# Patient Record
Sex: Female | Born: 2000 | Race: White | Hispanic: No | Smoking: Current some day smoker
Health system: Southern US, Community
[De-identification: ages and names within clinical notes are randomized; demographics above are authoritative.]

## PROBLEM LIST (undated history)

## (undated) VITALS — BP 80/46 | HR 150 | Temp 98.3°F | Resp 14 | Ht 64.37 in | Wt 103.6 lb

## (undated) DIAGNOSIS — F32A Depression, unspecified: Secondary | ICD-10-CM

## (undated) DIAGNOSIS — H7292 Unspecified perforation of tympanic membrane, left ear: Secondary | ICD-10-CM

## (undated) DIAGNOSIS — K000 Anodontia: Secondary | ICD-10-CM

## (undated) DIAGNOSIS — K08109 Complete loss of teeth, unspecified cause, unspecified class: Secondary | ICD-10-CM

## (undated) DIAGNOSIS — F319 Bipolar disorder, unspecified: Secondary | ICD-10-CM

## (undated) DIAGNOSIS — F82 Specific developmental disorder of motor function: Secondary | ICD-10-CM

## (undated) DIAGNOSIS — F419 Anxiety disorder, unspecified: Secondary | ICD-10-CM

## (undated) HISTORY — PX: TYMPANOPLASTY: SHX33

## (undated) HISTORY — PX: TYMPANOSTOMY TUBE PLACEMENT: SHX32

---
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
## 2019-01-18 ENCOUNTER — Telehealth: Payer: Self-pay | Admitting: Family Medicine

## 2019-02-23 ENCOUNTER — Other Ambulatory Visit: Payer: Self-pay

## 2019-02-23 ENCOUNTER — Ambulatory Visit (INDEPENDENT_AMBULATORY_CARE_PROVIDER_SITE_OTHER): Payer: Medicaid Other

## 2019-02-23 DIAGNOSIS — N946 Dysmenorrhea, unspecified: Secondary | ICD-10-CM | POA: Diagnosis not present

## 2019-02-23 DIAGNOSIS — Z7251 High risk heterosexual behavior: Secondary | ICD-10-CM

## 2019-02-23 DIAGNOSIS — Z23 Encounter for immunization: Secondary | ICD-10-CM

## 2019-02-23 LAB — POCT URINE PREGNANCY: Preg Test, Ur: NEGATIVE

## 2019-02-23 NOTE — Progress Notes (Signed)
Pt here for influenza vaccine.  Screening questionnaire reviewed, VIS provided to patient, and any/all patient questions answered.  Pt also request UPT while here.  She states that she has had unprotected sex and LMP was 12/07/2018.  Charyl Bigger, CMA

## 2019-03-31 ENCOUNTER — Other Ambulatory Visit: Payer: Self-pay

## 2019-03-31 ENCOUNTER — Ambulatory Visit (HOSPITAL_COMMUNITY)
Admission: RE | Admit: 2019-03-31 | Discharge: 2019-03-31 | Disposition: A | Discharge status: Home / Self Care | Payer: Medicaid Other | Attending: Psychiatry | Admitting: Psychiatry

## 2019-03-31 ENCOUNTER — Encounter (HOSPITAL_COMMUNITY): Payer: Self-pay | Admitting: Emergency Medicine

## 2019-03-31 ENCOUNTER — Emergency Department (HOSPITAL_COMMUNITY)
Admission: EM | Admit: 2019-03-31 | Discharge: 2019-04-01 | Disposition: A | Discharge status: Other Acute Inpatient Hospital | Payer: Medicaid Other | Attending: Emergency Medicine | Admitting: Emergency Medicine

## 2019-03-31 DIAGNOSIS — Z915 Personal history of self-harm: Secondary | ICD-10-CM | POA: Insufficient documentation

## 2019-03-31 DIAGNOSIS — Z20828 Contact with and (suspected) exposure to other viral communicable diseases: Secondary | ICD-10-CM | POA: Diagnosis not present

## 2019-03-31 DIAGNOSIS — R45851 Suicidal ideations: Secondary | ICD-10-CM | POA: Diagnosis not present

## 2019-03-31 DIAGNOSIS — Z79899 Other long term (current) drug therapy: Secondary | ICD-10-CM | POA: Insufficient documentation

## 2019-03-31 DIAGNOSIS — F17200 Nicotine dependence, unspecified, uncomplicated: Secondary | ICD-10-CM | POA: Insufficient documentation

## 2019-03-31 DIAGNOSIS — F32A Depression, unspecified: Secondary | ICD-10-CM

## 2019-03-31 DIAGNOSIS — F329 Major depressive disorder, single episode, unspecified: Secondary | ICD-10-CM | POA: Insufficient documentation

## 2019-03-31 DIAGNOSIS — F332 Major depressive disorder, recurrent severe without psychotic features: Secondary | ICD-10-CM | POA: Diagnosis present

## 2019-03-31 DIAGNOSIS — F17210 Nicotine dependence, cigarettes, uncomplicated: Secondary | ICD-10-CM | POA: Insufficient documentation

## 2019-03-31 LAB — CBC
HCT: 40.9 % (ref 36.0–46.0)
Hemoglobin: 14 g/dL (ref 12.0–15.0)
MCH: 31.9 pg (ref 26.0–34.0)
MCHC: 34.2 g/dL (ref 30.0–36.0)
MCV: 93.2 fL (ref 80.0–100.0)
Platelets: 260 10*3/uL (ref 150–400)
RBC: 4.39 MIL/uL (ref 3.87–5.11)
RDW: 12.1 % (ref 11.5–15.5)
WBC: 8.6 10*3/uL (ref 4.0–10.5)
nRBC: 0 % (ref 0.0–0.2)

## 2019-03-31 LAB — COMPREHENSIVE METABOLIC PANEL
ALT: 17 U/L (ref 0–44)
AST: 17 U/L (ref 15–41)
Albumin: 4.5 g/dL (ref 3.5–5.0)
Alkaline Phosphatase: 56 U/L (ref 38–126)
Anion gap: 12 (ref 5–15)
BUN: 8 mg/dL (ref 6–20)
CO2: 23 mmol/L (ref 22–32)
Calcium: 9.7 mg/dL (ref 8.9–10.3)
Chloride: 106 mmol/L (ref 98–111)
Creatinine, Ser: 0.69 mg/dL (ref 0.44–1.00)
GFR calc Af Amer: >60 mL/min (ref >60)
GFR calc non Af Amer: >60 mL/min (ref >60)
Glucose, Bld: 90 mg/dL (ref 70–99)
Potassium: 3.1 mmol/L — ABNORMAL LOW (ref 3.5–5.1)
Sodium: 141 mmol/L (ref 135–145)
Total Bilirubin: 0.2 mg/dL — ABNORMAL LOW (ref 0.3–1.2)
Total Protein: 7 g/dL (ref 6.5–8.1)

## 2019-03-31 LAB — ETHANOL: Alcohol, Ethyl (B): <10 mg/dL (ref <10)

## 2019-03-31 LAB — SALICYLATE LEVEL: Salicylate Lvl: <7 mg/dL (ref 2.8–30.0)

## 2019-03-31 LAB — RAPID URINE DRUG SCREEN, HOSP PERFORMED
Amphetamines: NOT DETECTED
Barbiturates: NOT DETECTED
Benzodiazepines: NOT DETECTED
Cocaine: NOT DETECTED
Opiates: NOT DETECTED
Tetrahydrocannabinol: NOT DETECTED

## 2019-03-31 LAB — ACETAMINOPHEN LEVEL: Acetaminophen (Tylenol), Serum: <10 ug/mL — ABNORMAL LOW (ref 10–30)

## 2019-03-31 LAB — I-STAT BETA HCG BLOOD, ED (MC, WL, AP ONLY): I-stat hCG, quantitative: <5 m[IU]/mL (ref <5)

## 2019-03-31 MED ORDER — POTASSIUM CHLORIDE CRYS ER 20 MEQ PO TBCR
40.0000 meq | EXTENDED_RELEASE_TABLET | Freq: Once | ORAL | Status: AC
  Administered 2019-03-31: 23:00:00 40 meq via ORAL
  Filled 2019-03-31: qty 2

## 2019-03-31 MED ORDER — ACETAMINOPHEN 325 MG PO TABS: 650.0000 mg | ORAL_TABLET | ORAL | Status: DC | PRN
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
## 2019-03-31 NOTE — ED Notes (Signed)
Pt denies SI/HI at this point in time
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
## 2019-04-01 LAB — SARS CORONAVIRUS 2 (TAT 6-24 HRS): SARS Coronavirus 2: NEGATIVE

## 2019-04-01 NOTE — ED Notes (Signed)
Breakfast tray ordered 
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
## 2019-04-01 NOTE — ED Notes (Signed)
Pt woke - ate breakfast - then returned to sleeping.  

## 2019-04-01 NOTE — ED Provider Notes (Signed)
Vitals:   04/01/19 0642 04/01/19 1221  BP: 110/74 100/68  Pulse: 85 99  Resp: 14 16  Temp: 98.4 F (36.9 C)   SpO2: 98% 98%    Patient accepted at [facility].  Labs reviewed.  Hemodynamically stable.  Covid negative.  EMTALA completed.   Varney Biles, MD 04/01/19 1259

## 2019-04-01 NOTE — Progress Notes (Signed)
This patient continues to meet inpatient criteria. CSW faxed information to the following facilities:   [facility] is at capacity, no appropriate bed is available at this time.  Stephanie Acre, [place] Social Worker [phone]

## 2019-04-01 NOTE — Progress Notes (Signed)
This [facility].  Bed: Aldona Lento  RN Call for report: [phone]  Accepting provider: Dr.Reddy  This patient is currently voluntary, she may arrive after Bear Dance, [place] Social Worker [phone]

## 2019-04-01 NOTE — ED Notes (Signed)
Pt voiced understanding and agreement w/tx plan - Accepted to [facility]. Pt calling her [place] [phone] - to advise.

## 2019-04-01 NOTE — BH Assessment (Signed)
[facility] Assessment Progress Note    Per Modesto Charon, NP, patient continues to be recommended for inpatient treatment

## 2019-04-01 NOTE — ED Notes (Signed)
Ordered bfast 

## 2019-04-01 NOTE — ED Notes (Signed)
Per Marijean Bravo from [facility], pt meets criteria for inpatient tx.  No beds available at [facility].  Awaiting placement.

## 2019-04-01 NOTE — ED Notes (Signed)
Safe Transport transporting pt to [place] belongings - 1 labeled belongings bag - Safe Transport - Pt aware.

## 2019-04-01 NOTE — ED Notes (Signed)
Lunch tray ordered 

## 2019-05-01 ENCOUNTER — Encounter: Payer: Self-pay | Admitting: Family Medicine

## 2019-05-01 ENCOUNTER — Other Ambulatory Visit: Payer: Self-pay

## 2019-05-01 ENCOUNTER — Ambulatory Visit (INDEPENDENT_AMBULATORY_CARE_PROVIDER_SITE_OTHER): Payer: Medicaid Other | Admitting: Family Medicine

## 2019-05-01 VITALS — Ht 69.0 in | Wt 120.0 lb

## 2019-05-01 DIAGNOSIS — T148XXA Other injury of unspecified body region, initial encounter: Secondary | ICD-10-CM | POA: Diagnosis not present

## 2019-05-01 DIAGNOSIS — M549 Dorsalgia, unspecified: Secondary | ICD-10-CM | POA: Diagnosis not present

## 2019-05-01 NOTE — Progress Notes (Signed)
Virtual / live video office visit note for Melissa [name], D.O- Primary Care Physician at [facility]   I connected with current patient today and beyond visually recognizing the correct individual, I verified that I am speaking with the correct person using two identifiers.  . Location of the patient: Home . Location of the provider: Office Only the patient (+/- their family members at pt's discretion) and myself were participating in the encounter    - This visit type was conducted due to national recommendations for restrictions regarding the COVID-19 Pandemic (e.g. social distancing) in an effort to limit this patient's exposure and mitigate transmission in our community.  This format is felt to be most appropriate for this patient at this time.   - The patient did have access to video technology today  - No physical exam could be performed with this format, beyond that communicated to [place] by the patient/ family members as noted.   - Additionally my office staff/ schedulers discussed with the patient that there may be a monetary charge related to this service, depending on patient's medical insurance.   The patient expressed understanding, and agreed to proceed.      History of Present Illness:  I, Melissa [name], am serving as scribe for Dr. Mellody Dance.  - Back Pain A week ago, "I was sitting down in a chair in the kitchen, a 'barstool type chair.'"  Notes she "felt a pop or a crack" when she got up to do something for her grandfather, and notes "I had to bend over because it felt like I was going to fall."  Says "I didn't feel pain, just a crack, but it was so painful after the crack."  If she sits up or back in a certain way, her back will hurt.  Quality: "It's a sharp pain."  Location: "Lower back, but also toward the middle."  Notes it radiates down to her inner thigh, but not beyond the knee.  Denies radiculopathy.  Says while making her bed to go to sleep  recently, her back and legs started hurting.  She started having headaches after her back injury.  Patient thinks her headaches are unrelated to the back ache.  Thinks her headaches are due to "being tired or stressed because of school."  Has not had back pain like this before.  Overall her back pain has improved.   Notes that walking helps it to improve.  Worse with: lifting; notes "carrying things in for my grandfather doesn't help."  Treating her symptoms with ibuprofen and heating pad.  Notes "took ibuprofen once and it didn't help."  Doesn't feel any pain first thing in the morning.  Has never woken up in the middle of the night with pain.  She has continued doing activities as normal; denies the pain altering her activities.  Notes that she had a little urinary concern (burning) recently until her "grandmother gave me a vitamin for it."  Denies runny nose, fever/chills, sore throat, loss of taste or smell.  Denies nausea, vomiting, diarrhea.    Depression screen [facility] 2/9 05/01/2019 07/22/2017 04/21/2017 01/27/2017 01/11/2017  Decreased Interest 0 0 2 2 1   Down, Depressed, Hopeless 0 1 1 1 2   PHQ - 2 Score 0 1 3 3 3   Altered sleeping 0 0 0 0 0  Tired, decreased energy 0 1 1 0 1  Change in appetite 0 1 0 0 2  Feeling bad or failure about yourself  2 1 2 3 1   Trouble concentrating 0 0 0 0 0  Moving slowly or fidgety/restless 0 0 0 0 0  Suicidal thoughts 0 0 2 1 2   PHQ-9 Score 2 4 8 7 9   Difficult doing work/chores Not difficult at all Not difficult at all Not difficult at all Somewhat difficult -    Impression and Recommendations:    1. Musculoskeletal back pain   2. Muscle strain    Headache and pharyngitis: - Had a negative COVID-19 test one month ago, but today still has symptoms of headache, sore throat etc. - Extensively reviewed covid symptoms with patient today.  Back pain: - Discussed that at her age, the most common cause of stated pain and symptoms will likely  be a muscle pull/strain.  Reviewed that if pain was shooting down her leg into her foot, this would be more indicative of neuropathic pain.  - To help alleviate symptoms, advised patient to avoid heavy lifting. - For back pain and muscle pulls, discussed importance of regular low-impact walking. - Told patient to avoid sitting around, and try to move around prudently as often as possible.  - Encouraged patient to continue to apply heating pad for 15-minute increments, 3-4 times per day.  - Patient may take 1-2 ibuprofen every 6 hours as needed, but only if pain is unmanageable.  - Reviewed that this pain should resolve within two weeks of onset. - Encouraged gentle back stretching and strengthening exercises after pain completely resolves.  - Handouts printed today for patient to pick up at her convenience-these are from sports med patient advisor.    - Additionally, discussion had with patient regarding txmnt plan, their biases about that plan etc were used in my medical decision making today.    - The patient agreed with the plan and demonstrated an understanding of the instructions.   No barriers to understanding were identified.    - Red flag symptoms and signs discussed in detail.  Patient expressed understanding regarding what to do in case of emergency\ urgent symptoms.  The patient was advised to call back or seek an in-person evaluation if the symptoms worsen or if the condition fails to improve as anticipated.   Return if symptoms worsen or fail to improve, for Told patient please follow-up for chronic care as previously discussed.  Due for yearly physical.    No orders of the defined types were placed in this encounter.   No orders of the defined types were placed in this encounter.   There are no discontinued medications.    Note:  This note was prepared with assistance of Dragon voice recognition software. Occasional wrong-word or sound-a-like substitutions may have  occurred due to the inherent limitations of voice recognition software.  This document serves as a record of services personally performed by Thomasene Loteborah Lisette Mancebo, DO. It was created on her behalf by Peggye FothergillKatherine Galloway, a trained medical scribe. The creation of this record is based on the scribe's personal observations and the provider's statements to them.   This case required medical decision making of at least moderate complexity. The above documentation has been reviewed to be accurate and was completed by Carlye Grippeeborah J. Terrace Fontanilla, D.O.       Patient Care Team    Relationship Specialty Notifications Start End  Thomasene Lotpalski, Kariyah Baugh, DO PCP - General Family Medicine  11/19/15    [facility]    -Vitals obtained; medications/ allergies reconciled;  personal medical, social, Sx  etc.histories were updated by CMA, reviewed by me and are reflected in chart  Patient Active Problem List   Diagnosis Date Noted  . Musculoskeletal back pain 05/01/2019  . Muscle strain 05/01/2019  . Intentional overdose of drug in tablet form (HCC)   . General counseling and advice for contraceptive management 11/19/2015  . Encounter for childhood immunizations appropriate for age 68/13/2017  . Encounter for initial prescription of injectable contraceptive 11/19/2015  . Bipolar disorder, current episode depressed, severe, without psychotic features (HCC) 02/22/2015  . Post traumatic stress disorder (PTSD) 06/30/2013     Current Meds  Medication Sig  . busPIRone (BUSPAR) 10 MG tablet Take 1 tablet (10 mg total) by mouth 2 (two) times daily.  . Calcium Carbonate-Vitamin D (CALCIUM 500 + D) 500-125 MG-UNIT TABS Take 1 tablet by mouth 2 (two) times daily with a meal.  . carbamazepine (TEGRETOL-XR) 200 MG 12 hr tablet Take 1 tablet (200 mg total) by mouth 2 (two) times daily.  . Melatonin 5 MG TABS Take 10 mg by mouth at bedtime as needed (for sleep).  . Multiple Vitamin (MULTIVITAMIN WITH  MINERALS) TABS tablet Take 1 tablet by mouth daily.  . sertraline (ZOLOFT) 100 MG tablet Take 1 tablet (100 mg total) by mouth daily.     No Known Allergies   ROS:  See above HPI for pertinent positives and negatives   Objective:   Height 5\' 9"  (1.753 m), weight 120 lb (54.4 kg), last menstrual period 03/31/2019.  (if some vitals are omitted, this means that patient was UNABLE to obtain them even though they were asked to get them prior to OV today.  They were asked to call 04/02/2019 at their earliest convenience with these once obtained.)  General: A & O * 3; visually in no acute distress; in usual state of health.  Skin: Visible skin appears normal and pt's usual skin color HEENT:  EOMI, head is normocephalic and atraumatic.  Sclera are anicteric. Neck has a good range of motion.  Lips are noncyanotic Chest: normal chest excursion and movement Respiratory: speaking in full sentences, no conversational dyspnea; no use of accessory muscles Psych: insight good, mood- appears full

## 2019-06-01 ENCOUNTER — Ambulatory Visit: Payer: Medicaid Other | Attending: Internal Medicine

## 2019-06-01 ENCOUNTER — Other Ambulatory Visit: Payer: Medicaid Other

## 2019-06-01 DIAGNOSIS — U071 COVID-19: Secondary | ICD-10-CM

## 2019-06-02 LAB — NOVEL CORONAVIRUS, NAA: SARS-CoV-2, NAA: NOT DETECTED

## 2019-08-26 ENCOUNTER — Encounter (HOSPITAL_COMMUNITY): Payer: Self-pay | Admitting: Emergency Medicine

## 2019-08-26 ENCOUNTER — Other Ambulatory Visit: Payer: Self-pay

## 2019-08-26 ENCOUNTER — Ambulatory Visit (HOSPITAL_COMMUNITY)
Admission: EM | Admit: 2019-08-26 | Discharge: 2019-08-26 | Disposition: A | Discharge status: Home / Self Care | Payer: Medicaid Other | Attending: Family Medicine | Admitting: Family Medicine

## 2019-08-26 DIAGNOSIS — Z3202 Encounter for pregnancy test, result negative: Secondary | ICD-10-CM | POA: Diagnosis not present

## 2019-08-26 DIAGNOSIS — Z113 Encounter for screening for infections with a predominantly sexual mode of transmission: Secondary | ICD-10-CM

## 2019-08-26 DIAGNOSIS — R300 Dysuria: Secondary | ICD-10-CM | POA: Diagnosis present

## 2019-08-26 LAB — POCT URINALYSIS DIP (DEVICE)
Bilirubin Urine: NEGATIVE
Glucose, UA: NEGATIVE mg/dL
Nitrite: NEGATIVE
Protein, ur: >=300 mg/dL — AB
Specific Gravity, Urine: >=1.03 (ref 1.005–1.030)
Urobilinogen, UA: 1 mg/dL (ref 0.0–1.0)
pH: 7 (ref 5.0–8.0)

## 2019-08-26 LAB — POCT PREGNANCY, URINE: Preg Test, Ur: NEGATIVE

## 2019-08-26 LAB — POC URINE PREG, ED: Preg Test, Ur: NEGATIVE

## 2019-08-26 MED ORDER — NITROFURANTOIN MONOHYD MACRO 100 MG PO CAPS: 100.0000 mg | ORAL_CAPSULE | Freq: Two times a day (BID) | ORAL | 0 refills | Status: DC

## 2019-08-26 NOTE — Discharge Instructions (Addendum)
Treating you for a urinary tract infection.  We will call you if any changes need to be made to your medicine. Also sending a swab for STD testing we will call you with any positive results of that. Make sure you increase your water intake you do appear to be mildly dehydrated on your urine sample. You can take AZO over-the-counter for discomfort as needed for urinary symptoms

## 2019-08-26 NOTE — ED Triage Notes (Signed)
Pt sts dysuria x 1 week and also requesting STD screening

## 2019-08-27 ENCOUNTER — Telehealth (HOSPITAL_COMMUNITY): Payer: Self-pay

## 2019-08-27 MED ORDER — NITROFURANTOIN MONOHYD MACRO 100 MG PO CAPS: 100.0000 mg | ORAL_CAPSULE | Freq: Two times a day (BID) | ORAL | 0 refills | Status: DC

## 2019-08-27 NOTE — Telephone Encounter (Signed)
Pt called stating that [facility] was closed yesterday and requested Rx for macrobid be sent to [facility] on [address] instead. Sent Rx to pharmacy, attempted to contact pt to inform her that Rx was indeed sent to the new pharmacy of choice, but her voicemail "mailbox was full".

## 2019-08-28 LAB — URINE CULTURE: Culture: >=100000 — AB

## 2019-08-28 NOTE — ED Provider Notes (Signed)
[place]    CSN: [id number] Arrival date & time: 08/26/19  1405      History   Chief Complaint Chief Complaint  Patient presents with  . Dysuria  . Exposure to STD    HPI Melissa [name] is a 19 y.o. female.   Patient is a 19 year old female presents today with dysuria, urinary frequency and lower abdominal discomfort that has been constant over the past week.  Started after having sexual intercourse, unprotected.  Concern for STDs.  Has also noticed some hematuria.  No back pain, fevers, chills, nausea or vomiting.  No vaginal discharge, itching or irritation.  ROS per HPI      Past Medical History:  Diagnosis Date  . Bipolar disorder ([place])   . Motor skills developmental delay   . No natural teeth   . 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 Musculoskeletal back pain 05/01/2019  . Muscle strain 05/01/2019  . Intentional overdose of drug in tablet form ([place])   . General counseling and advice for contraceptive management 11/19/2015  . Encounter for childhood immunizations appropriate for age 36/13/2017  . Encounter for initial prescription of injectable contraceptive 11/19/2015  . Bipolar disorder, current episode depressed, severe, without psychotic features ([place]) 02/22/2015  . Post traumatic stress disorder (PTSD) 06/30/2013    Past Surgical History:  Procedure Laterality Date  . ALVEOLOPLASTY  02/15/2017   x 4  . MULTIPLE TOOTH EXTRACTIONS  02/15/2017   #1 through #32 - also palatal tori removal  . TYMPANOPLASTY     unilateral - unknown side  . TYMPANOPLASTY Left 04/09/2017   Procedure: TYMPANOPLASTY;  Surgeon: Rozetta Nunnery, MD;  Location: [facility];  Service: ENT;  Laterality: Left;  . TYMPANOSTOMY TUBE PLACEMENT Bilateral     OB History   No obstetric history on file.      Home Medications    Prior to Admission medications   Medication Sig Start  Date End Date Taking? Authorizing Provider  busPIRone (BUSPAR) 10 MG tablet Take 1 tablet (10 mg total) by mouth 2 (two) times daily. 09/29/18   Ambrose Finland, MD  Calcium Carbonate-Vitamin D (CALCIUM 500 + D) 500-125 MG-UNIT TABS Take 1 tablet by mouth 2 (two) times daily with a meal.    [provider]  carbamazepine (TEGRETOL-XR) 200 MG 12 hr tablet Take 1 tablet (200 mg total) by mouth 2 (two) times daily. 09/29/18   Ambrose Finland, MD  Melatonin 5 MG TABS Take 10 mg by mouth at bedtime as needed (for sleep).    [provider]  Multiple Vitamin (MULTIVITAMIN WITH MINERALS) TABS tablet Take 1 tablet by mouth daily.    [provider]  nitrofurantoin, macrocrystal-monohydrate, (MACROBID) 100 MG capsule Take 1 capsule (100 mg total) by mouth 2 (two) times daily. 08/27/19   Loura Halt A, NP  sertraline (ZOLOFT) 100 MG tablet Take 1 tablet (100 mg total) by mouth daily. 09/29/18   Ambrose Finland, MD    Family History Family History  Problem Relation Age of Onset  . Seizures Mother        as a child    Social History Social History   Tobacco Use  . Smoking status: Light Tobacco Smoker    Years: 2.00  . Smokeless tobacco: Never Used  . Tobacco comment: somes a few cigarettes a month  Substance Use Topics  . Alcohol use: No  Comment: Pt denied  . Drug use: Yes    Types: Marijuana    Comment: Pt states she uses drugs when she is with "her older sister"; last time used waslast week     Allergies   Patient has no known allergies.   Review of Systems Review of Systems   Physical Exam Triage Vital Signs ED Triage Vitals [08/26/19 1508]  Enc Vitals Group     BP 115/79     Pulse Rate 95     Resp 18     Temp 97.7 F (36.5 C)     Temp Source Oral     SpO2 99 %     Weight      Height      Head Circumference      Peak Flow      Pain Score 5     Pain Loc      Pain Edu?      Excl. in GC?    No data  found.  Updated Vital Signs BP 115/79 (BP Location: Right Arm)   Pulse 95   Temp 97.7 F (36.5 C) (Oral)   Resp 18   SpO2 99%   Visual Acuity Right Eye Distance:   Left Eye Distance:   Bilateral Distance:    Right Eye Near:   Left Eye Near:    Bilateral Near:     Physical Exam Vitals and nursing note reviewed.  Constitutional:      General: She is not in acute distress.    Appearance: Normal appearance. She is not ill-appearing, toxic-appearing or diaphoretic.  HENT:     Head: Normocephalic.     Nose: Nose normal.     Mouth/Throat:     Pharynx: Oropharynx is clear.  Eyes:     Conjunctiva/sclera: Conjunctivae normal.  Pulmonary:     Effort: Pulmonary effort is normal.  Musculoskeletal:        General: Normal range of motion.     Cervical back: Normal range of motion.  Skin:    General: Skin is warm and dry.     Findings: No rash.  Neurological:     Mental Status: She is alert.  Psychiatric:        Mood and Affect: Mood normal.      UC Treatments / Results  Labs (all labs ordered are listed, but only abnormal results are displayed) Labs Reviewed  URINE CULTURE - Abnormal; Notable for the following components:      Result Value   Culture >=100,000 COLONIES/mL ESCHERICHIA COLI (*)    Organism ID, Bacteria ESCHERICHIA COLI (*)    All other components within normal limits  POCT URINALYSIS DIP (DEVICE) - Abnormal; Notable for the following components:   Ketones, ur TRACE (*)    Hgb urine dipstick MODERATE (*)    Protein, ur >=300 (*)    Leukocytes,Ua SMALL (*)    All other components within normal limits  POCT PREGNANCY, URINE  POC URINE PREG, ED  CERVICOVAGINAL ANCILLARY ONLY    EKG   Radiology No results found.  Procedures Procedures (including critical care time)  Medications Ordered in UC Medications - No data to display  Initial Impression / Assessment and Plan / UC Course  I have reviewed the triage vital signs and the nursing  notes.  Pertinent labs & imaging results that were available during my care of the patient were reviewed by me and considered in my medical decision making (see chart for details).  Dysuria-urine with small leuks, moderate hemoglobin Sending for culture and will go ahead and treat for urinary tract infection with Macrobid. Sending swab for STD testing Labs pending Recommend increase fluid intake to include water and avoid bladder irritating beverages like soda and tea Final Clinical Impressions(s) / UC Diagnoses   Final diagnoses:  Dysuria  Screening for STD (sexually transmitted disease)     Discharge Instructions     Treating you for a urinary tract infection.  We will call you if any changes need to be made to your medicine. Also sending a swab for STD testing we will call you with any positive results of that. Make sure you increase your water intake you do appear to be mildly dehydrated on your urine sample. You can take AZO over-the-counter for discomfort as needed for urinary symptoms    ED Prescriptions    Medication Sig Dispense Auth. Provider   nitrofurantoin, macrocrystal-monohydrate, (MACROBID) 100 MG capsule Take 1 capsule (100 mg total) by mouth 2 (two) times daily. 10 capsule Dahlia Byes A, NP     PDMP not reviewed this encounter.   Dahlia Byes A, NP 08/28/19 1050

## 2019-08-29 LAB — CERVICOVAGINAL ANCILLARY ONLY
Bacterial vaginitis: NEGATIVE
Candida vaginitis: NEGATIVE
Chlamydia: NEGATIVE
Neisseria Gonorrhea: NEGATIVE
Trichomonas: NEGATIVE

## 2019-12-05 ENCOUNTER — Encounter: Payer: Medicaid Other | Admitting: Physician Assistant

## 2020-01-08 ENCOUNTER — Encounter: Payer: Medicaid Other | Admitting: Physician Assistant

## 2020-01-17 ENCOUNTER — Other Ambulatory Visit: Payer: Self-pay

## 2020-01-17 DIAGNOSIS — N912 Amenorrhea, unspecified: Secondary | ICD-10-CM

## 2020-01-18 ENCOUNTER — Other Ambulatory Visit: Payer: Medicaid Other

## 2020-01-18 ENCOUNTER — Other Ambulatory Visit: Payer: Self-pay

## 2020-01-18 DIAGNOSIS — N912 Amenorrhea, unspecified: Secondary | ICD-10-CM

## 2020-01-19 LAB — BETA HCG QUANT (REF LAB): hCG Quant: <1 m[IU]/mL

## 2020-01-21 IMAGING — DX DG CHEST 1V PORT
1 series · 1 of 1 positions shown · non-contrast
Comparison: none

CLINICAL DATA: Post-intubation. Drug overdose. Smoker.intubation

EXAM:
PORTABLE CHEST 1 VIEW

[chest ap]
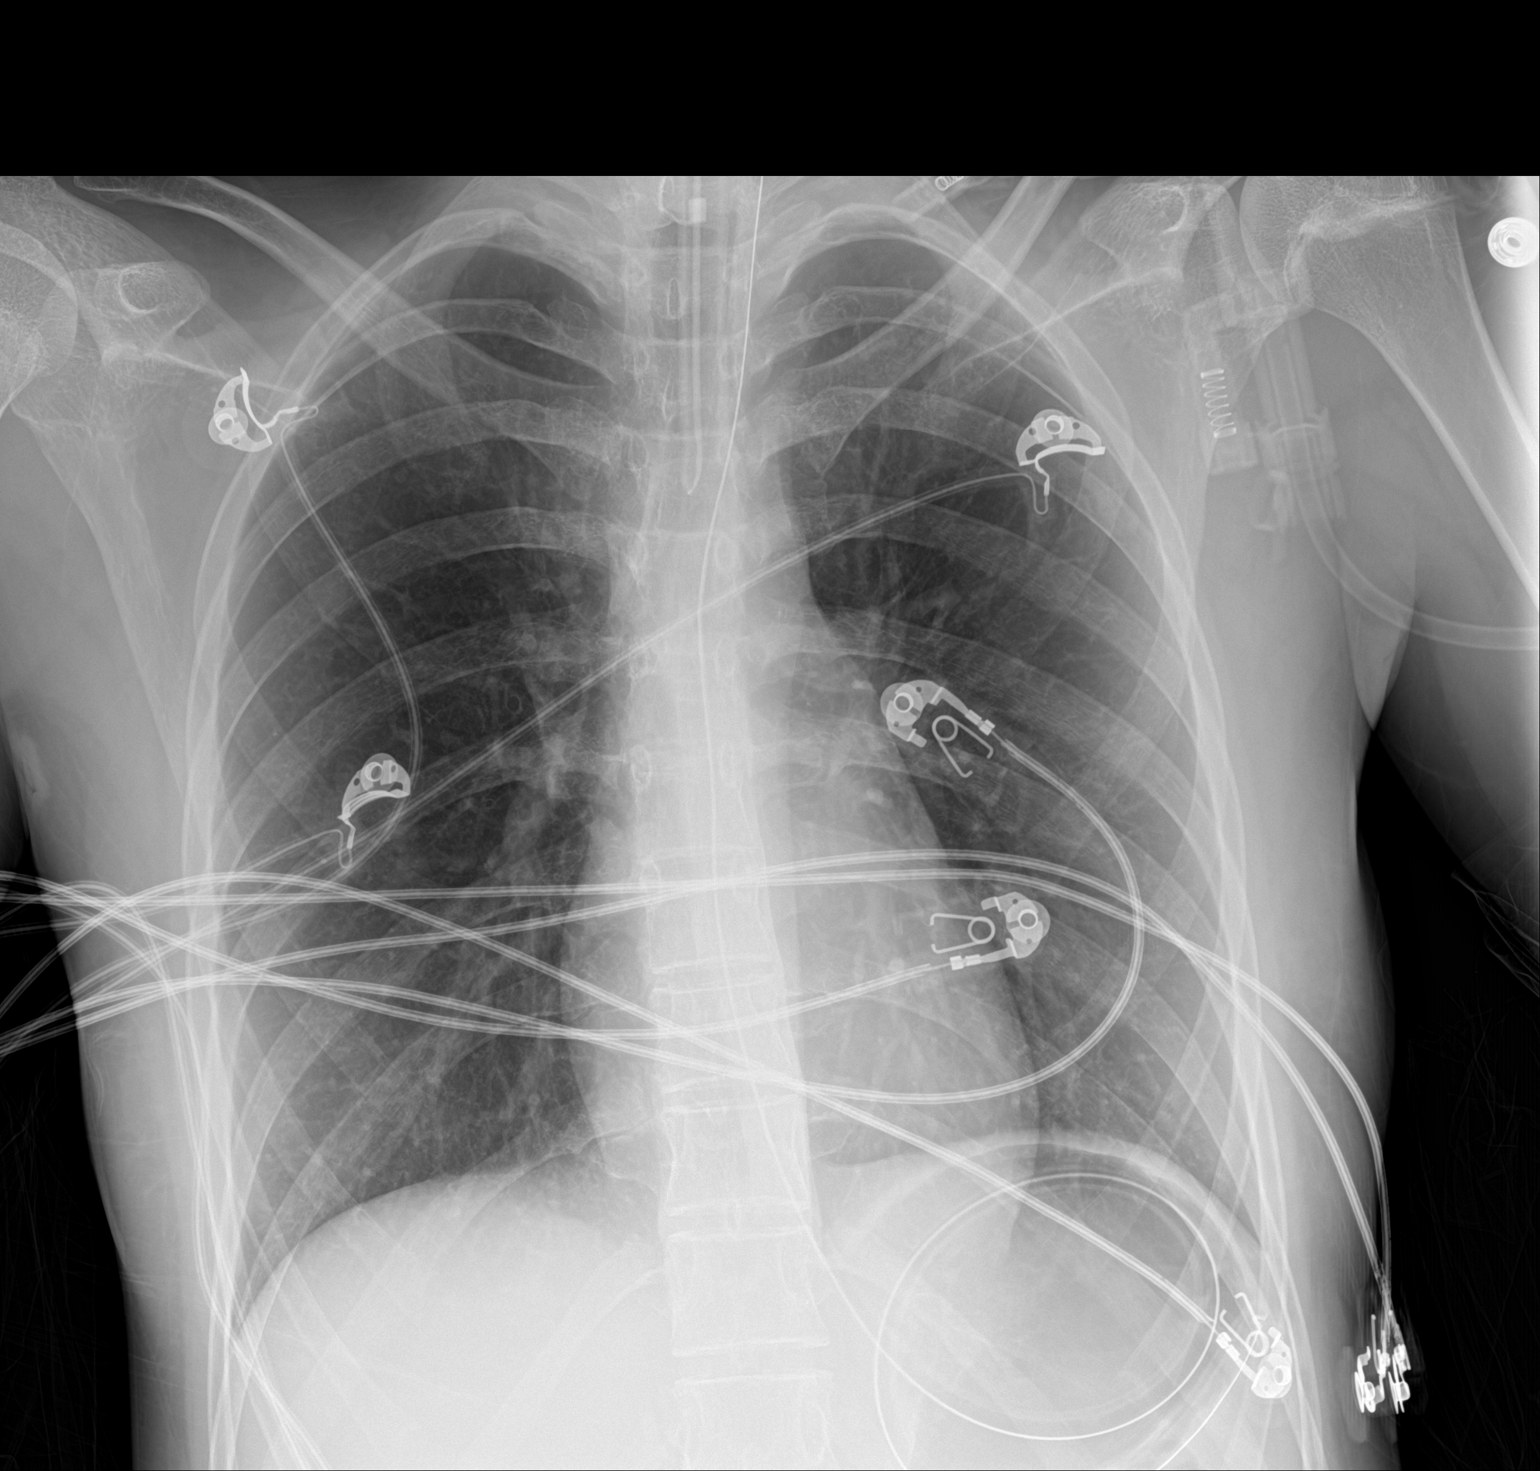

[1 of 1 positions shown; findings below may reference images not displayed]

FINDINGS: Endotracheal tube 3.5 cm from carina. NG tube in stomach. Normal
cardiac silhouette. Lungs are clear. No pneumothorax.
IMPRESSION: 1. Support apparatus in good position.
2. Lungs clear.

## 2020-01-24 ENCOUNTER — Encounter: Payer: Self-pay | Admitting: Emergency Medicine

## 2020-01-24 ENCOUNTER — Ambulatory Visit (HOSPITAL_COMMUNITY)
Admission: EM | Admit: 2020-01-24 | Discharge: 2020-01-24 | Discharge status: Against Medical Advice | Payer: Medicaid Other

## 2020-01-24 ENCOUNTER — Other Ambulatory Visit: Payer: Self-pay

## 2020-01-24 ENCOUNTER — Ambulatory Visit
Admission: EM | Admit: 2020-01-24 | Discharge: 2020-01-24 | Disposition: A | Discharge status: Home / Self Care | Payer: Medicaid Other | Attending: Emergency Medicine | Admitting: Emergency Medicine

## 2020-01-24 ENCOUNTER — Telehealth: Payer: Self-pay | Admitting: Physician Assistant

## 2020-01-24 DIAGNOSIS — J069 Acute upper respiratory infection, unspecified: Secondary | ICD-10-CM | POA: Diagnosis not present

## 2020-01-24 DIAGNOSIS — Z7689 Persons encountering health services in other specified circumstances: Secondary | ICD-10-CM | POA: Diagnosis not present

## 2020-01-24 NOTE — ED Provider Notes (Signed)
[name]    CSN: [id number] Arrival date & time: 01/24/20  1618      History   Chief Complaint Chief Complaint  Patient presents with  . work note    HPI Melissa [name] is a 19 y.o. female presenting for note to be out of return to work.  States that she was sick yesterday with cold-like symptoms including nasal congestion, dry cough and malaise.  Had to miss work due to this, though states she is feeling better today.  Did not undergo Covid testing.  No known sick contacts.  Denies palpitations, chest pain, shortness of breath.    Past Medical History:  Diagnosis Date  . Anxiety    [employer]reesia 12/03/2019  . Bipolar disorder ([facility])   . Depression    [employer]reesia 12/03/2019  . Motor skills developmental delay   . No natural teeth   . 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 Musculoskeletal back pain 05/01/2019  . Muscle strain 05/01/2019  . Intentional overdose of drug in tablet form ([facility])   . General counseling and advice for contraceptive management 11/19/2015  . Encounter for childhood immunizations appropriate for age 38/13/2017  . Encounter for initial prescription of injectable contraceptive 11/19/2015  . Bipolar disorder, current episode depressed, severe, without psychotic features ([facility]) 02/22/2015  . Post traumatic stress disorder (PTSD) 06/30/2013    Past Surgical History:  Procedure Laterality Date  . ALVEOLOPLASTY  02/15/2017   x 4  . MULTIPLE TOOTH EXTRACTIONS  02/15/2017   #1 through #32 - also palatal tori removal  . TYMPANOPLASTY     unilateral - unknown side  . TYMPANOPLASTY Left 04/09/2017   Procedure: TYMPANOPLASTY;  Surgeon: Drema Halon, MD;  Location: [facility];  Service: ENT;  Laterality: Left;  . TYMPANOSTOMY TUBE PLACEMENT Bilateral     OB History   No obstetric history on file.      Home Medications    Prior to Admission medications     Medication Sig Start Date End Date Taking? Authorizing Provider  busPIRone (BUSPAR) 10 MG tablet Take 1 tablet (10 mg total) by mouth 2 (two) times daily. 09/29/18   Leata Mouse, MD  Calcium Carbonate-Vitamin D (CALCIUM 500 + D) 500-125 MG-UNIT TABS Take 1 tablet by mouth 2 (two) times daily with a meal.    [provider]  carbamazepine (TEGRETOL-XR) 200 MG 12 hr tablet Take 1 tablet (200 mg total) by mouth 2 (two) times daily. 09/29/18   Leata Mouse, MD  Melatonin 5 MG TABS Take 10 mg by mouth at bedtime as needed (for sleep).    [provider]  Multiple Vitamin (MULTIVITAMIN WITH MINERALS) TABS tablet Take 1 tablet by mouth daily.    [provider]  sertraline (ZOLOFT) 100 MG tablet Take 1 tablet (100 mg total) by mouth daily. 09/29/18   Leata Mouse, MD    Family History Family History  Problem Relation Age of Onset  . Seizures Mother        as a child    Social History Social History   Tobacco Use  . Smoking status: Light Tobacco Smoker    Years: 2.00  . Smokeless tobacco: Never Used  . Tobacco comment: somes a few cigarettes a month  Vaping Use  . Vaping Use: Some days  Substance Use Topics  . Alcohol use: No    Comment: Pt denied  . Drug use:  Yes    Types: Marijuana    Comment: Pt states she uses drugs when she is with "her older sister"; last time used waslast week     Allergies   Patient has no known allergies.   Review of Systems As per HPI   Physical Exam Triage Vital Signs ED Triage Vitals  Enc Vitals Group     BP 01/24/20 1636 114/80     Pulse Rate 01/24/20 1636 (!) 106     Resp 01/24/20 1636 16     Temp 01/24/20 1636 98.2 F (36.8 C)     Temp Source 01/24/20 1636 Oral     SpO2 01/24/20 1636 98 %     Weight --      Height --      Head Circumference --      Peak Flow --      Pain Score 01/24/20 1638 0     Pain Loc --      Pain Edu? --      Excl. in GC? --    No data  found.  Updated Vital Signs BP 114/80 (BP Location: Left Arm)   Pulse (!) 106   Temp 98.2 F (36.8 C) (Oral)   Resp 16   SpO2 98%   Visual Acuity Right Eye Distance:   Left Eye Distance:   Bilateral Distance:    Right Eye Near:   Left Eye Near:    Bilateral Near:     Physical Exam Constitutional:      General: She is not in acute distress. HENT:     Head: Normocephalic and atraumatic.  Eyes:     General: No scleral icterus.    Pupils: Pupils are equal, round, and reactive to light.  Cardiovascular:     Rate and Rhythm: Normal rate.  Pulmonary:     Effort: Pulmonary effort is normal.  Skin:    Coloration: Skin is not jaundiced or pale.  Neurological:     Mental Status: She is alert and oriented to person, place, and time.      UC Treatments / Results  Labs (all labs ordered are listed, but only abnormal results are displayed) Labs Reviewed  NOVEL CORONAVIRUS, NAA    EKG   Radiology No results found.  Procedures Procedures (including critical care time)  Medications Ordered in UC Medications - No data to display  Initial Impression / Assessment and Plan / UC Course  I have reviewed the triage vital signs and the nursing notes.  Pertinent labs & imaging results that were available during my care of the patient were reviewed by me and considered in my medical decision making (see chart for details).     Patient afebrile, nontoxic, with SpO2 98%.  Covid PCR pending.  Patient to quarantine until results are back.  We will treat supportively as outlined below.  Return precautions discussed, patient verbalized understanding and is agreeable to plan. Final Clinical Impressions(s) / UC Diagnoses   Final diagnoses:  Return to work evaluation  Upper respiratory tract infection, unspecified type     Discharge Instructions     Your COVID test is pending - it is important to quarantine / isolate at home until your results are back. If you test positive  and would like further evaluation for persistent or worsening symptoms, you may schedule an E-visit or virtual (video) visit throughout the [facility] app or website.  PLEASE NOTE: If you develop severe chest pain or shortness of breath please go  to the ER or call [phone] for further evaluation --> DO NOT schedule electronic or virtual visits for this. Please call our office for further guidance / recommendations as needed.  For information about the Covid vaccine, please visit [email or web address]    ED Prescriptions    None     PDMP not reviewed this encounter.   Hall[place], [place], [place] 01/24/20 1652

## 2020-01-24 NOTE — Telephone Encounter (Signed)
Spoke with Kandis Cocking who states we are unable to give note without seeing patient. Patient has been advised. Mother states patient has not been seen by UC but that they will take her. AS, CMA

## 2020-01-24 NOTE — Discharge Instructions (Signed)
Your COVID test is pending - it is important to quarantine / isolate at home until your results are back. °If you test positive and would like further evaluation for persistent or worsening symptoms, you may schedule an E-visit or virtual (video) visit throughout the  MyChart app or website. ° °PLEASE NOTE: If you develop severe chest pain or shortness of breath please go to the ER or call 9-1-1 for further evaluation --> DO NOT schedule electronic or virtual visits for this. °Please call our office for further guidance / recommendations as needed. ° °For information about the Covid vaccine, please visit [email or web address] °

## 2020-01-24 NOTE — ED Triage Notes (Signed)
Pt requesting work note to return to work; pt sts was sick yesterday with cold sx that are now resolved; pt sts she told her PCP she was seen at [facility] yesterday but now admits she was never seen

## 2020-01-24 NOTE — Telephone Encounter (Signed)
Patient is requesting return to work note, she says she was seen at [facility] yesterday and needs a note to for work to return.

## 2020-01-25 ENCOUNTER — Other Ambulatory Visit: Payer: Medicaid Other

## 2020-01-25 LAB — NOVEL CORONAVIRUS, NAA: SARS-CoV-2, NAA: NOT DETECTED

## 2020-01-25 LAB — SARS-COV-2, NAA 2 DAY TAT

## 2020-02-16 ENCOUNTER — Encounter: Payer: Self-pay | Admitting: Physician Assistant

## 2020-02-19 ENCOUNTER — Other Ambulatory Visit: Payer: Medicaid Other

## 2020-02-21 ENCOUNTER — Telehealth: Payer: Self-pay | Admitting: Physician Assistant

## 2020-02-21 NOTE — Telephone Encounter (Signed)
Called patient and advised these symptoms she is describing could be cardiovascular or neurological and that we suggest she be seen emergently for evaluation. Patient verbalized understanding and said she would seek care at urgent care. AS, CMA

## 2020-02-21 NOTE — Telephone Encounter (Signed)
Patient called in stating she was light headed, dizzy, and feeling like she was going to faint. Appointment made for October 12. I recommended her to urgent care but she wanted an appointment and did not take offer on going to urgent care for sooner help.

## 2020-03-19 ENCOUNTER — Telehealth: Payer: Self-pay | Admitting: Physician Assistant

## 2020-03-19 ENCOUNTER — Ambulatory Visit: Payer: Medicaid Other | Admitting: Physician Assistant

## 2020-03-19 NOTE — Telephone Encounter (Signed)
Patient is requesting a call back about some clinical information she wants to discuss, pt had an acute appt that had to be c/x because of provider out of office.

## 2020-03-20 NOTE — Telephone Encounter (Signed)
Attempted to call patient. Unable to leave voicemail due to VM box being full. AS, CMA

## 2020-03-20 NOTE — Telephone Encounter (Signed)
Attempted to call with no answer. Unable to leave voicemail. AS, CMA

## 2020-03-21 ENCOUNTER — Telehealth: Payer: Self-pay | Admitting: Physician Assistant

## 2020-03-21 NOTE — Telephone Encounter (Signed)
Patient also advised we can schedule apt to discuss birth control medications if she would like. Patient declined. AS, CMA

## 2020-03-21 NOTE — Telephone Encounter (Signed)
Patient called stating she just stopped taking Depo injections and was wondering about some side effects she is having: headaches, hot flashes, nausea. I advised patient these were normal symptoms of Depo medication but that she should call the prescribing provider and voice concern to them. Patient plans to call [facility]. AS, CMA

## 2020-03-21 NOTE — Telephone Encounter (Signed)
Patient is requesting a call back from a female clinical staff memeber, she has some female health questions/concerns that she wants to discuss. She did not want to go into details over the phone, I told her I would be more than happy to send a message to nurse to discuss. Please contact when available.

## 2020-03-21 NOTE — Telephone Encounter (Signed)
Attempted to call patient with no answer. Unable to leave voicemail. AS, CMA 

## 2020-03-21 NOTE — Telephone Encounter (Signed)
Attempted to call patient again with no answer. Unable to leave voicemail. Saving message to chart. AS, CMA

## 2020-04-09 ENCOUNTER — Ambulatory Visit: Payer: Self-pay

## 2020-08-10 ENCOUNTER — Ambulatory Visit
Admission: EM | Admit: 2020-08-10 | Discharge: 2020-08-10 | Disposition: A | Discharge status: Home / Self Care | Payer: Medicaid Other | Attending: Emergency Medicine | Admitting: Emergency Medicine

## 2020-08-10 ENCOUNTER — Encounter: Payer: Self-pay | Admitting: *Deleted

## 2020-08-10 ENCOUNTER — Other Ambulatory Visit: Payer: Self-pay

## 2020-08-10 DIAGNOSIS — N390 Urinary tract infection, site not specified: Secondary | ICD-10-CM

## 2020-08-10 LAB — POCT URINALYSIS DIP (MANUAL ENTRY)
Glucose, UA: 100 mg/dL — AB
Leukocytes, UA: NEGATIVE
Nitrite, UA: POSITIVE — AB
Protein Ur, POC: 30 mg/dL — AB
Spec Grav, UA: >=1.03 — AB (ref 1.010–1.025)
Urobilinogen, UA: 1 E.U./dL
pH, UA: 5 (ref 5.0–8.0)

## 2020-08-10 LAB — POCT URINE PREGNANCY: Preg Test, Ur: NEGATIVE

## 2020-08-10 MED ORDER — CEPHALEXIN 500 MG PO CAPS: 500.0000 mg | ORAL_CAPSULE | Freq: Two times a day (BID) | ORAL | 0 refills | Status: AC

## 2020-08-10 NOTE — ED Provider Notes (Signed)
[name]    CSN: [id number] Arrival date & time: 08/10/20  1510      History   Chief Complaint Chief Complaint  Patient presents with  . Dysuria    HPI Melissa [name] is a 20 y.o. female presenting today for evaluation of possible UTI.  Reports dysuria, urinary frequency urgency nausea and back pain.  Symptoms began this morning.  Denies fevers.  Does report history of prior UTI.  Denies vaginal symptoms.  Last menstrual cycle 07/05/2020.  Reports taking Azo prior to arrival.  HPI  Past Medical History:  Diagnosis Date  . Anxiety    Phreesia 12/03/2019  . Bipolar disorder (HCC)   . Depression    Phreesia 12/03/2019  . Motor skills developmental delay   . No natural teeth   . 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 Musculoskeletal back pain 05/01/2019  . Muscle strain 05/01/2019  . Intentional overdose of drug in tablet form (HCC)   . General counseling and advice for contraceptive management 11/19/2015  . Encounter for childhood immunizations appropriate for age 31/13/2017  . Encounter for initial prescription of injectable contraceptive 11/19/2015  . Bipolar disorder, current episode depressed, severe, without psychotic features (HCC) 02/22/2015  . Post traumatic stress disorder (PTSD) 06/30/2013    Past Surgical History:  Procedure Laterality Date  . ALVEOLOPLASTY  02/15/2017   x 4  . MULTIPLE TOOTH EXTRACTIONS  02/15/2017   #1 through #32 - also palatal tori removal  . TYMPANOPLASTY     unilateral - unknown side  . TYMPANOPLASTY Left 04/09/2017   Procedure: TYMPANOPLASTY;  Surgeon: Drema Halon, MD;  Location: [facility];  Service: ENT;  Laterality: Left;  . TYMPANOSTOMY TUBE PLACEMENT Bilateral     OB History   No obstetric history on file.      Home Medications    Prior to Admission medications   Medication Sig Start Date End Date Taking?  Authorizing Provider  cephALEXin (KEFLEX) 500 MG capsule Take 1 capsule (500 mg total) by mouth 2 (two) times daily for 5 days. 08/10/20 08/15/20 Yes Hallel Denherder C, PA-C  Calcium Carbonate-Vitamin D 500-125 MG-UNIT TABS Take 1 tablet by mouth 2 (two) times daily with a meal.  08/10/20  [provider]  carbamazepine (TEGRETOL-XR) 200 MG 12 hr tablet Take 1 tablet (200 mg total) by mouth 2 (two) times daily. 09/29/18 08/10/20  Leata Mouse, MD  sertraline (ZOLOFT) 100 MG tablet Take 1 tablet (100 mg total) by mouth daily. 09/29/18 08/10/20  Leata Mouse, MD    Family History Family History  Problem Relation Age of Onset  . Seizures Mother        as a child    Social History Social History   Tobacco Use  . Smoking status: Current Some Day Smoker    Years: 2.00  . Smokeless tobacco: Never Used  . Tobacco comment: somes a few cigarettes a month  Vaping Use  . Vaping Use: Every day  Substance Use Topics  . Alcohol use: No    Comment: rarely  . Drug use: Yes    Types: Marijuana     Allergies   Patient has no known allergies.   Review of Systems Review of Systems  Constitutional: Negative for fever.  Respiratory: Negative for shortness of breath.   Cardiovascular: Negative for chest pain.  Gastrointestinal: Negative for abdominal pain, diarrhea, nausea and vomiting.  Genitourinary:  Positive for dysuria, frequency and urgency. Negative for flank pain, genital sores, hematuria, menstrual problem, vaginal bleeding, vaginal discharge and vaginal pain.  Musculoskeletal: Negative for back pain.  Skin: Negative for rash.  Neurological: Negative for dizziness, light-headedness and headaches.     Physical Exam Triage Vital Signs ED Triage Vitals [08/10/20 1525]  Enc Vitals Group     BP 114/73     Pulse Rate (!) 106     Resp 16     Temp 98.5 F (36.9 C)     Temp Source Temporal     SpO2 98 %     Weight      Height      Head Circumference       Peak Flow      Pain Score 6     Pain Loc      Pain Edu?      Excl. in GC?    No data found.  Updated Vital Signs BP 114/73   Pulse (!) 106   Temp 98.5 F (36.9 C) (Temporal)   Resp 16   LMP 07/05/2020 (Approximate) Comment: States stopped using DepoProvera 02/2020  SpO2 98%   Visual Acuity Right Eye Distance:   Left Eye Distance:   Bilateral Distance:    Right Eye Near:   Left Eye Near:    Bilateral Near:     Physical Exam Vitals and nursing note reviewed.  Constitutional:      Appearance: She is well-developed and well-nourished.     Comments: No acute distress  HENT:     Head: Normocephalic and atraumatic.     Nose: Nose normal.  Eyes:     Conjunctiva/sclera: Conjunctivae normal.  Cardiovascular:     Rate and Rhythm: Normal rate.  Pulmonary:     Effort: Pulmonary effort is normal. No respiratory distress.  Abdominal:     General: There is no distension.  Musculoskeletal:        General: Normal range of motion.     Cervical back: Neck supple.  Skin:    General: Skin is warm and dry.  Neurological:     Mental Status: She is alert and oriented to person, place, and time.  Psychiatric:        Mood and Affect: Mood and affect normal.      UC Treatments / Results  Labs (all labs ordered are listed, but only abnormal results are displayed) Labs Reviewed  POCT URINALYSIS DIP (MANUAL ENTRY) - Abnormal; Notable for the following components:      Result Value   Color, UA orange (*)    Glucose, UA =100 (*)    Bilirubin, UA small (*)    Ketones, POC UA trace (5) (*)    Spec Grav, UA >=1.030 (*)    Blood, UA trace-intact (*)    Protein Ur, POC =30 (*)    Nitrite, UA Positive (*)    All other components within normal limits  URINE CULTURE  POCT URINE PREGNANCY    EKG   Radiology No results found.  Procedures Procedures (including critical care time)  Medications Ordered in UC Medications - No data to display  Initial Impression / Assessment and  Plan / UC Course  I have reviewed the triage vital signs and the nursing notes.  Pertinent labs & imaging results that were available during my care of the patient were reviewed by me and considered in my medical decision making (see chart for details).     Clinical symptoms suggestive  of UTI, took Azo, likely affecting positive UA results, urine culture pending.  Treating with Keflex x5 days for UTI.  Push fluids.  Alter therapy as needed based off culture sensitivities.  Discussed strict return precautions. Patient verbalized understanding and is agreeable with plan.  Final Clinical Impressions(s) / UC Diagnoses   Final diagnoses:  Lower urinary tract infection, acute     Discharge Instructions     Begin Keflex twice daily for 5 days Drink plenty of fluids May continue Azo to help with discomfort Follow-up if not improving or worsening    ED Prescriptions    Medication Sig Dispense Auth. Provider   cephALEXin (KEFLEX) 500 MG capsule Take 1 capsule (500 mg total) by mouth 2 (two) times daily for 5 days. 10 capsule Raheel Kunkle, [place] C, PA-C     PDMP not reviewed this encounter.   Lew Dawes, [place] 08/10/20 1615

## 2020-08-10 NOTE — Discharge Instructions (Signed)
Begin Keflex twice daily for 5 days Drink plenty of fluids May continue Azo to help with discomfort Follow-up if not improving or worsening

## 2020-08-10 NOTE — ED Triage Notes (Signed)
C/O dysuria, polyuria, urinary urgency, nausea and back pain onset this AM with no known fevers.

## 2020-08-12 LAB — URINE CULTURE: Culture: <10000 — AB

## 2020-10-05 ENCOUNTER — Encounter: Payer: Self-pay | Admitting: Physician Assistant

## 2020-10-09 ENCOUNTER — Telehealth: Payer: Self-pay | Admitting: Physician Assistant

## 2020-10-09 NOTE — Telephone Encounter (Signed)
Patient left a voicemail stating she has some swelling issues and did not disclose where. She has seasonal allergies. She thinks it might be a side effect from a medication which she did not disclose either. She mentioned a surgery that was not disclosed as well and that she would like her throat checked. Please advise, thanks.

## 2020-10-09 NOTE — Telephone Encounter (Signed)
Patient called and was transferred to our CMA.

## 2020-10-09 NOTE — Telephone Encounter (Signed)
Pt scheduled for 10/15/20 to discuss issue with provider. AS, CMA

## 2020-10-15 ENCOUNTER — Ambulatory Visit (INDEPENDENT_AMBULATORY_CARE_PROVIDER_SITE_OTHER): Payer: Medicaid Other | Admitting: Nurse Practitioner

## 2020-10-15 ENCOUNTER — Other Ambulatory Visit: Payer: Self-pay

## 2020-10-15 ENCOUNTER — Encounter: Payer: Self-pay | Admitting: Nurse Practitioner

## 2020-10-15 VITALS — BP 99/68 | HR 98 | Temp 98.4°F | Ht 68.0 in | Wt 103.1 lb

## 2020-10-15 DIAGNOSIS — R1319 Other dysphagia: Secondary | ICD-10-CM | POA: Diagnosis not present

## 2020-10-15 DIAGNOSIS — Z9889 Other specified postprocedural states: Secondary | ICD-10-CM | POA: Diagnosis not present

## 2020-10-15 DIAGNOSIS — R634 Abnormal weight loss: Secondary | ICD-10-CM | POA: Diagnosis not present

## 2020-10-15 NOTE — Progress Notes (Signed)
Acute Office Visit  Subjective:    Patient ID: Melissa [name], female    DOB: January 01, 2001, 20 y.o.   MRN: [id number]  Chief Complaint  Patient presents with  . Dysphagia    HPI Patient is in today for evaluation of difficulty swallowing. She states that this started about two weeks after having "sinus lift" procedure to make room for dental implant. She states that initially, she was having trouble swallowing pills. This has progressed into problems swallowing food and liquids as well. She states that it feels as though substances are going down through the esophagus too quickly. This is causing her to cough up the food or liquid, makes her feel as though she is choking, even though she is not. She states that she oftne feels like her throat is closing up while she is eating or drinking. She states that she did contact the surgeon who did procedure on her sinuses. Was told that tere was probably a lot of mucus which was draining into the back of the throat causing her to feel this way. She does have appointment with the surgeon on 6/8, however, symptoms continue to worsen for her. She states that the difficulty swallowing is making it difficult for her to get nutrition from food she eats. She has lost nearly ten pounds since she had sinus procedure. She is tired, feels weak, and is frequently lightheaded.  She states that she continues to have sinus congestion and copious amounts of post nasal drip. She wakes up feeling very congested some days. She denies headache, fever, sore throat, or ear pain. She denies heartburn type symptoms. She denies nausea or vomiting, just the constant spitting and coughing up of food and liquid when trying to eat or drink.   Past Medical History:  Diagnosis Date  . Anxiety    Phreesia 12/03/2019  . Bipolar disorder (HCC)   . Depression    Phreesia 12/03/2019  . Motor skills developmental delay   . No natural teeth   . 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 ALVEOLOPLASTY  02/15/2017   x 4  . MULTIPLE TOOTH EXTRACTIONS  02/15/2017   #1 through #32 - also palatal tori removal  . TYMPANOPLASTY     unilateral - unknown side  . TYMPANOPLASTY Left 04/09/2017   Procedure: TYMPANOPLASTY;  Surgeon: Drema Halon, MD;  Location: [facility];  Service: ENT;  Laterality: Left;  . TYMPANOSTOMY TUBE PLACEMENT Bilateral     Family History  Problem Relation Age of Onset  . Seizures Mother        as a child    Social History   Socioeconomic History  . Marital status: Significant Other    Spouse name: Not on file  . Number of children: Not on file  . Years of education: Not on file  . Highest education level: Not on file  Occupational History  . Not on file  Tobacco Use  . Smoking status: Current Some Day Smoker    Years: 2.00  . Smokeless tobacco: Never Used  . Tobacco comment: somes a few cigarettes a month  Vaping Use  . Vaping Use: Every day  Substance and Sexual Activity  . Alcohol use: No    Comment: rarely  . Drug use: Yes    Types: Marijuana  . Sexual activity: Not Currently  Other Topics Concern  . Not on file  Social History Narrative  Maternal grandparents are legal guardians; pt. lives with them.  To bring documentation of guardianship DOS.   Social Determinants of Health   Financial Resource Strain: Not on file  Food Insecurity: Not on file  Transportation Needs: Not on file  Physical Activity: Not on file  Stress: Not on file  Social Connections: Not on file  Intimate Partner Violence: Not on file    No outpatient medications prior to visit.   No facility-administered medications prior to visit.    No Known Allergies  Review of Systems  Constitutional: Positive for fatigue and unexpected weight change. Negative for chills and fever.       Has lost about 10 pounds since she had procedure to lift sinuses.   HENT:  Positive for congestion, dental problem and trouble swallowing. Negative for sinus pain.   Eyes: Negative.   Respiratory: Positive for choking. Negative for cough, chest tightness, shortness of breath and wheezing.   Cardiovascular: Negative for chest pain and palpitations.  Gastrointestinal: Negative for constipation.  Genitourinary: Negative.   Musculoskeletal: Negative for back pain and myalgias.  Skin: Negative for rash.  Allergic/Immunologic: Positive for environmental allergies.  Neurological: Negative for dizziness, tremors, numbness and headaches.  Psychiatric/Behavioral: The patient is nervous/anxious.   All other systems reviewed and are negative.      Objective:    Physical Exam Vitals and nursing note reviewed.  Constitutional:      Appearance: Normal appearance. She is well-developed and underweight. She is ill-appearing.  HENT:     Head: Normocephalic.     Right Ear: Ear canal and external ear normal.     Left Ear: Ear canal and external ear normal.     Nose: Nose normal.     Mouth/Throat:     Mouth: Mucous membranes are moist.     Pharynx: Oropharynx is clear. No oropharyngeal exudate or posterior oropharyngeal erythema.  Eyes:     Extraocular Movements: Extraocular movements intact.     Conjunctiva/sclera: Conjunctivae normal.     Pupils: Pupils are equal, round, and reactive to light.  Cardiovascular:     Rate and Rhythm: Normal rate and regular rhythm.     Pulses: Normal pulses.     Heart sounds: Normal heart sounds.  Pulmonary:     Effort: Pulmonary effort is normal.     Breath sounds: Normal breath sounds.  Abdominal:     General: Bowel sounds are normal.     Palpations: Abdomen is soft.     Tenderness: There is no abdominal tenderness.  Musculoskeletal:        General: Normal range of motion.     Cervical back: Normal range of motion and neck supple. No tenderness.  Lymphadenopathy:     Cervical: No cervical adenopathy.  Skin:    General: Skin is  warm and dry.  Neurological:     General: No focal deficit present.     Mental Status: She is alert and oriented to person, place, and time.  Psychiatric:        Mood and Affect: Mood normal.        Behavior: Behavior normal.        Thought Content: Thought content normal.        Judgment: Judgment normal.     Today's Vitals   10/15/20 1048  BP: 99/68  Pulse: 98  Temp: 98.4 F (36.9 C)  SpO2: 100%  Weight: 103 lb 1.6 oz (46.8 kg)  Height: 5\' 8"  (1.727 m)   Body  mass index is 15.68 kg/m.   Wt Readings from Last 3 Encounters:  10/15/20 103 lb 1.6 oz (46.8 kg)  05/01/19 120 lb (54.4 kg) (37 %, Z= -0.34)*  03/31/19 112 lb (50.8 kg) (21 %, Z= -0.82)*   * Growth percentiles are based on CDC (Girls, 2-20 Years) data.    Health Maintenance Due  Topic Date Due  . COVID-19 Vaccine (1) Never done  . HPV VACCINES (3 - 3-dose series) 07/30/2017  . Hepatitis C Screening  Never done  . CHLAMYDIA SCREENING  08/25/2020       Topic Date Due  . HPV VACCINES (3 - 3-dose series) 07/30/2017     Lab Results  Component Value Date   TSH 4.422 09/24/2018   Lab Results  Component Value Date   WBC 8.6 03/31/2019   HGB 14.0 03/31/2019   HCT 40.9 03/31/2019   MCV 93.2 03/31/2019   PLT 260 03/31/2019   Lab Results  Component Value Date   NA 141 03/31/2019   K 3.1 (L) 03/31/2019   CO2 23 03/31/2019   GLUCOSE 90 03/31/2019   BUN 8 03/31/2019   CREATININE 0.69 03/31/2019   BILITOT 0.2 (L) 03/31/2019   ALKPHOS 56 03/31/2019   AST 17 03/31/2019   ALT 17 03/31/2019   PROT 7.0 03/31/2019   ALBUMIN 4.5 03/31/2019   CALCIUM 9.7 03/31/2019   ANIONGAP 12 03/31/2019   GFR 179.95 09/21/2012   Lab Results  Component Value Date   CHOL 191 (H) 07/07/2018   Lab Results  Component Value Date   HDL 47 07/07/2018   Lab Results  Component Value Date   LDLCALC 121 (H) 07/07/2018   Lab Results  Component Value Date   TRIG 116 07/07/2018   Lab Results  Component Value Date    CHOLHDL 4.1 07/07/2018   Lab Results  Component Value Date   HGBA1C 5.1 07/07/2018       Assessment & Plan:  1. Esophageal dysphagia Patient having difficulty swallowing food, liquid, and medications. Will get UGI with barium to evaluate for esophageal stricture or motility disorder.  - DG UGI W DOUBLE CM (HD BA); Future  2. Unintentional weight loss Patient unable to eat due to problems with dysphagia. She has had more than ten pound weight loss since onset of symptoms. UGI with barium ordered for further evaluation. Refer as indicated.  - DG UGI W DOUBLE CM (HD BA); Future  3. S/P sinus surgery Dysphagia started about two weeks after having sinus surgery. She does have follow up appointment with surgeon 11/13/2020.   Problem List Items Addressed This Visit      Digestive   Esophageal dysphagia - Primary   Relevant Orders   DG UGI W DOUBLE CM (HD BA)     Other   Unintentional weight loss   Relevant Orders   DG UGI W DOUBLE CM (HD BA)   S/P sinus surgery         Carlean Jews, NP

## 2020-10-15 NOTE — Patient Instructions (Signed)
Bradley's Neurology in Clinical Practice (8th ed., pp. 152-163). [place], [place]: Elsevier."> [employer] Textbook of Surgery (21st ed., pp. 1056-1078). [place], [place]: Elsevier, Inc.">  Dysphagia  Dysphagia is trouble swallowing. This condition occurs when solids and liquids stick in a person's throat on the way down to the stomach, or when food takes longer to get to the stomach than usual. You may have problems swallowing food, liquids, or both. You may also have pain while trying to swallow. It may take you more time and effort to swallow something. What are the causes? This condition may be caused by:  Muscle problems. These may make it difficult for you to move food and liquids through the esophagus, which is the tube that connects your mouth to your stomach.  Blockages. You may have ulcers, scar tissue, or inflammation that blocks the normal passage of food and liquids. Causes of these problems include: ? Acid reflux from your stomach into your esophagus (gastroesophageal reflux). ? Infections. ? Radiation treatment for cancer. ? Medicines taken without enough fluids to wash them down into your stomach.  Stroke. This can affect the nerves and make it difficult to swallow.  Nerve problems. These prevent signals from being sent to the muscles of your esophagus to squeeze (contract) and move what you swallow down to your stomach.  Globus pharyngeus. This is a common problem that involves a feeling like something is stuck in your throat or a sense of trouble with swallowing, even though nothing is wrong with the swallowing passages.  Certain conditions, such as cerebral palsy or Parkinson's disease. What are the signs or symptoms? Common symptoms of this condition include:  A feeling that solids or liquids are stuck in your throat on the way down to the stomach.  Pain while swallowing.  Coughing or gagging while trying to swallow. Other symptoms include:  Food moving back from  your stomach to your mouth (regurgitation).  Noises coming from your throat.  Chest discomfort when swallowing.  A feeling of fullness when swallowing.  Drooling, especially when the throat is blocked.  Heartburn. How is this diagnosed? This condition may be diagnosed by:  Barium swallow X-ray. In this test, you will swallow a white liquid that sticks to the inside of your esophagus. X-ray images are then taken.  Endoscopy. In this test, a flexible telescope is inserted down your throat to look at your esophagus and your stomach.  CT scans or an MRI. How is this treated? Treatment for dysphagia depends on the cause of this condition:  If the dysphagia is caused by acid reflux or infection, medicines may be used. These may include antibiotics or heartburn medicines.  If the dysphagia is caused by problems with the muscles, swallowing therapy may be used to help you strengthen your swallowing muscles. You may have to do specific exercises to strengthen the muscles or stretch them.  If the dysphagia is caused by a blockage or mass, procedures to remove the blockage may be done. You may need surgery and a feeding tube. You may need to make diet changes. Ask your health care provider for specific instructions. Follow these instructions at home: Medicines  Take over-the-counter and prescription medicines only as told by your health care provider.  If you were prescribed an antibiotic medicine, take it as told by your health care provider. Do not stop taking the antibiotic even if you start to feel better. Eating and drinking  Make any diet changes as told by your health care provider.    Work with a diet and nutrition specialist (dietitian) to create an eating plan that will help you get the nutrients you need in order to stay healthy.  Eat soft foods that are easier to swallow.  Cut your food into small pieces and eat slowly. Take small bites.  Eat and drink only when you are  sitting upright.  Do not drink alcohol or caffeine. If you need help quitting, ask your health care provider.   General instructions  Check your weight every day to make sure you are not losing weight.  Do not use any products that contain nicotine or tobacco. These products include cigarettes, chewing tobacco, and vaping devices, such as e-cigarettes. If you need help quitting, ask your health care provider.  Keep all follow-up visits. This is important. Contact a health care provider if:  You lose weight because you cannot swallow.  You cough when you drink liquids.  You cough up partially digested food. Get help right away if:  You cannot swallow your saliva.  You have shortness of breath, a fever, or both.  Your voice is hoarse and you have trouble swallowing. These symptoms may represent a serious problem that is an emergency. Do not wait to see if the symptoms will go away. Get medical help right away. Call your local emergency services (911 in the [place] Do not drive yourself to the hospital. Summary  Dysphagia is trouble swallowing. This condition occurs when solids and liquids stick in a person's throat on the way down to the stomach. You may cough or gag while trying to swallow.  Dysphagia has many possible causes.  Treatment for dysphagia depends on the cause of the condition.  Keep all follow-up visits. This is important. This information is not intended to replace advice given to you by your health care provider. Make sure you discuss any questions you have with your health care provider. Document Revised: 01/13/2020 Document Reviewed: 01/13/2020 Elsevier Patient Education  2021 Elsevier Inc.  

## 2020-10-23 ENCOUNTER — Telehealth: Payer: Self-pay | Admitting: Physician Assistant

## 2020-10-23 NOTE — Telephone Encounter (Signed)
Thank you! I will let patient know. She has not been called to date, I will provide the number to her. thanks

## 2020-10-23 NOTE — Telephone Encounter (Signed)
Called patient and provided phone number [phone] to [facility].

## 2020-10-23 NOTE — Telephone Encounter (Signed)
I ordered an upper GI for her. I can see the order in imaging orders.

## 2020-10-23 NOTE — Telephone Encounter (Signed)
Patient called in reference to an xray order for her throat- please advise I do not see a referral for this patient. Thank you

## 2020-10-23 NOTE — Telephone Encounter (Signed)
Thanks so much. Were you also able to see the order?

## 2021-09-29 ENCOUNTER — Encounter: Payer: Self-pay | Admitting: Physician Assistant

## 2021-09-29 ENCOUNTER — Ambulatory Visit (INDEPENDENT_AMBULATORY_CARE_PROVIDER_SITE_OTHER): Payer: Medicaid Other | Admitting: Physician Assistant

## 2021-09-29 VITALS — BP 108/74 | HR 75 | Temp 97.7°F | Ht 68.0 in | Wt 97.5 lb

## 2021-09-29 DIAGNOSIS — R300 Dysuria: Secondary | ICD-10-CM

## 2021-09-29 DIAGNOSIS — F419 Anxiety disorder, unspecified: Secondary | ICD-10-CM

## 2021-09-29 LAB — POCT URINALYSIS DIPSTICK
Bilirubin, UA: NEGATIVE
Blood, UA: NEGATIVE
Glucose, UA: NEGATIVE
Ketones, UA: NEGATIVE
Nitrite, UA: NEGATIVE
Protein, UA: NEGATIVE
Spec Grav, UA: 1.025 (ref 1.010–1.025)
Urobilinogen, UA: 2 E.U./dL — AB
pH, UA: 7 (ref 5.0–8.0)

## 2021-09-29 MED ORDER — HYDROXYZINE HCL 25 MG PO TABS: 25.0000 mg | ORAL_TABLET | Freq: Three times a day (TID) | ORAL | 0 refills | Status: AC | PRN

## 2021-09-29 MED ORDER — NITROFURANTOIN MONOHYD MACRO 100 MG PO CAPS: 100.0000 mg | ORAL_CAPSULE | Freq: Two times a day (BID) | ORAL | 0 refills | Status: DC

## 2021-09-29 NOTE — Progress Notes (Signed)
?  Established patient acute visit ? ? ?Patient: Melissa [name]   DOB: 2000-09-09   21 y.o. Female  MRN: [id number] ?Visit Date: 09/29/2021 ? ?Chief Complaint  ?Patient presents with  ? Dysuria  ?   ?  ? ?Subjective  ?  ?HPI ?HPI   ? ? Dysuria   ? Additional comments:  ? ? ?  ?  ?Last edited by Sylvester Harder, CMA on 09/29/2021  4:02 PM.  ?  ?  ?Patient presents with c/o back discomfort, urinary frequency with intermittent dysuria. No cloudy urine, hematuria, or fever. States around the time she is about to start her period so having some cramps. No concerns about STD. Reports did take some Azo and aspirin. Symptoms started last Wednesday. Patient reports has hx of anxiety and has been experiencing panic attacks due to anxiety with current symptoms. ? ? ?Medications: ?No outpatient medications prior to visit.  ? ?No facility-administered medications prior to visit.  ? ? ?Review of Systems ?Review of Systems:  ?A fourteen system review of systems was performed and found to be positive as per HPI. ? ? ? ?  Objective  ?  ?BP 108/74   Pulse 75   Temp 97.7 ?F (36.5 ?C)   Ht 5\' 8"  (1.727 m)   Wt 97 lb 8 oz (44.2 kg)   SpO2 100%   BMI 14.82 kg/m?  ? ? ?Physical Exam  ?General:  Well Developed, well nourished, appropriate for stated age.  ?Neuro:  Alert and oriented,  extra-ocular muscles intact  ?HEENT:  Normocephalic, atraumatic, neck supple  ?Skin:  no gross rash, warm, pink. ?Abdomen: non-distended, non-tender, no CVA tenderness, no guarding  ?Respiratory: Speaking in full sentences, unlabored. ?Vascular:  Ext warm, no cyanosis apprec.; cap RF less 2 sec. ?Psych:  No HI/SI, judgement and insight good, Euthymic mood. Full Affect. ? ?No results found for any visits on 09/29/21. ? Assessment & Plan  ?  ? ?Patient has s/sx suggestive of cystitis. Patient did take Azo which could interfere with UA results so will send for urine culture. Will start empiric antibiotic therapy with nitrofurantoin 100 mg Bid x 5 days.  Recommend adequate hydration. Also discussed with patient will start hydroxyzine 25 mg to take every 8 hrs as needed for anxiety. If anxiety fails to improve or worsen recommend to follow-up. ? ? ?Return if symptoms worsen or fail to improve.  ?   ? ? ? ?10/01/21, PA-C  ?[facility] ?[phone] (phone) ?[phone] (fax) ? ?[facility] ?

## 2021-09-29 NOTE — Patient Instructions (Signed)

## 2021-10-02 ENCOUNTER — Encounter: Payer: Self-pay | Admitting: Physician Assistant

## 2021-10-02 LAB — URINE CULTURE

## 2021-11-06 ENCOUNTER — Ambulatory Visit (INDEPENDENT_AMBULATORY_CARE_PROVIDER_SITE_OTHER): Payer: Medicaid Other | Admitting: Physician Assistant

## 2021-11-06 ENCOUNTER — Encounter: Payer: Self-pay | Admitting: Physician Assistant

## 2021-11-06 VITALS — BP 107/76 | HR 109 | Temp 97.9°F | Ht 68.0 in | Wt 96.0 lb

## 2021-11-06 DIAGNOSIS — N898 Other specified noninflammatory disorders of vagina: Secondary | ICD-10-CM | POA: Diagnosis not present

## 2021-11-06 DIAGNOSIS — Z113 Encounter for screening for infections with a predominantly sexual mode of transmission: Secondary | ICD-10-CM

## 2021-11-06 DIAGNOSIS — N644 Mastodynia: Secondary | ICD-10-CM

## 2021-11-06 LAB — POCT URINE PREGNANCY: Preg Test, Ur: NEGATIVE

## 2021-11-06 NOTE — Progress Notes (Signed)
  Established patient acute visit   Patient: Melissa [name]   DOB: May 17, 2001   21 y.o. Female  MRN: [id number] Visit Date: 11/06/2021  No chief complaint on file.  Subjective    HPI  Patient presents with vaginal discharge which started yesterday around noon, only has noticed it twice. Thought it was green but looks more milky. Also reports today started having lower abdominal pain. Nausea started yesterday. No vomiting, dysuria or fever. Also reports breast soreness which started yesterday. No nipple discharge, mass or inversion. States Friday night had unprotected sex with a new partner.   LMP: 10/23/21    Medications: Outpatient Medications Prior to Visit  Medication Sig   hydrOXYzine (ATARAX) 25 MG tablet Take 1 tablet (25 mg total) by mouth every 8 (eight) hours as needed for anxiety.   [DISCONTINUED] nitrofurantoin, macrocrystal-monohydrate, (MACROBID) 100 MG capsule Take 1 capsule (100 mg total) by mouth 2 (two) times daily. (Patient not taking: Reported on 11/06/2021)   No facility-administered medications prior to visit.    Review of Systems Review of Systems:  A fourteen system review of systems was performed and found to be positive as per HPI.      Objective    BP 107/76 (BP Location: Left Arm, Patient Position: Sitting)   Pulse (!) 109   Temp 97.9 F (36.6 C) (Oral)   Ht 5\' 8"  (1.727 m)   Wt 96 lb (43.5 kg)   LMP 10/23/2021   BMI 14.60 kg/m    Physical Exam  General:  Well Developed, well nourished, appropriate for stated age.  Neuro:  Alert and oriented,  extra-ocular muscles intact  HEENT:  Normocephalic, atraumatic, neck supple  Skin:  no gross rash, warm, pink. Cardiac:  RRR, S1 S2 Abdomen: mild tenderness of LLQ, +BS, no CVA tenderness Breast/GU: pt deferred Respiratory: CTA B/L  Vascular:  Ext warm, no cyanosis apprec.; cap RF less 2 sec. Psych:  No HI/SI, judgement and insight good, Euthymic mood. Full Affect.   Results for orders placed or  performed in visit on 11/06/21  POCT urine pregnancy  Result Value Ref Range   Preg Test, Ur Negative Negative    Assessment & Plan     Patient presenting with c/o vaginal discharge so will place lab orders to evaluate for BV, candidal infection and with recent unprotected sex will screen for STD/trichomonas. Will also r/o pregnancy.    Return if symptoms worsen or fail to improve.        01/06/22, PA-C  [facility] [phone] (phone) [phone] (fax)  [facility]

## 2021-11-07 LAB — HIV ANTIBODY (ROUTINE TESTING W REFLEX): HIV Screen 4th Generation wRfx: NONREACTIVE

## 2021-11-07 LAB — CBC WITH DIFFERENTIAL/PLATELET
Basophils Absolute: 0 10*3/uL (ref 0.0–0.2)
Basos: 0 %
EOS (ABSOLUTE): 0 10*3/uL (ref 0.0–0.4)
Eos: 0 %
Hematocrit: 43.7 % (ref 34.0–46.6)
Hemoglobin: 14.9 g/dL (ref 11.1–15.9)
Immature Grans (Abs): 0 10*3/uL (ref 0.0–0.1)
Immature Granulocytes: 0 %
Lymphocytes Absolute: 3 10*3/uL (ref 0.7–3.1)
Lymphs: 27 %
MCH: 31.6 pg (ref 26.6–33.0)
MCHC: 34.1 g/dL (ref 31.5–35.7)
MCV: 93 fL (ref 79–97)
Monocytes Absolute: 0.5 10*3/uL (ref 0.1–0.9)
Monocytes: 5 %
Neutrophils Absolute: 7.5 10*3/uL — ABNORMAL HIGH (ref 1.4–7.0)
Neutrophils: 68 %
Platelets: 372 10*3/uL (ref 150–450)
RBC: 4.71 x10E6/uL (ref 3.77–5.28)
RDW: 12.5 % (ref 11.7–15.4)
WBC: 11.1 10*3/uL — ABNORMAL HIGH (ref 3.4–10.8)

## 2021-11-07 LAB — HEPATITIS C ANTIBODY: Hep C Virus Ab: NONREACTIVE

## 2021-11-07 LAB — RPR QUALITATIVE: RPR Ser Ql: NONREACTIVE

## 2021-11-07 LAB — HEPATITIS B SURFACE ANTIBODY,QUALITATIVE: Hep B Surface Ab, Qual: NONREACTIVE

## 2021-11-09 LAB — NUSWAB VAGINITIS PLUS (VG+)
Candida albicans, NAA: NEGATIVE
Candida glabrata, NAA: NEGATIVE
Chlamydia trachomatis, NAA: NEGATIVE
Neisseria gonorrhoeae, NAA: NEGATIVE
Trich vag by NAA: NEGATIVE

## 2021-11-10 LAB — GC/CHLAMYDIA PROBE AMP
Chlamydia trachomatis, NAA: NEGATIVE
Neisseria Gonorrhoeae by PCR: NEGATIVE

## 2021-11-13 ENCOUNTER — Other Ambulatory Visit: Payer: Self-pay | Admitting: Physician Assistant

## 2021-11-13 DIAGNOSIS — R7989 Other specified abnormal findings of blood chemistry: Secondary | ICD-10-CM

## 2021-11-14 ENCOUNTER — Other Ambulatory Visit: Payer: Medicaid Other

## 2021-11-14 DIAGNOSIS — R7989 Other specified abnormal findings of blood chemistry: Secondary | ICD-10-CM

## 2021-11-15 LAB — CBC
Hematocrit: 43.4 % (ref 34.0–46.6)
Hemoglobin: 14.5 g/dL (ref 11.1–15.9)
MCH: 31.7 pg (ref 26.6–33.0)
MCHC: 33.4 g/dL (ref 31.5–35.7)
MCV: 95 fL (ref 79–97)
Platelets: 330 10*3/uL (ref 150–450)
RBC: 4.57 x10E6/uL (ref 3.77–5.28)
RDW: 12.8 % (ref 11.7–15.4)
WBC: 9.3 10*3/uL (ref 3.4–10.8)

## 2021-12-24 NOTE — Patient Instructions (Signed)
Preventive Care 22-22 Years Old, Female Preventive care refers to lifestyle choices and visits with your health care provider that can promote health and wellness. At this stage in your life, you may start seeing a primary care physician instead of a pediatrician for your preventive care. Preventive care visits are also called wellness exams. What can I expect for my preventive care visit? Counseling During your preventive care visit, your health care provider may ask about your: Medical history, including: Past medical problems. Family medical history. Pregnancy history. Current health, including: Menstrual cycle. Method of birth control. Emotional well-being. Home life and relationship well-being. Sexual activity and sexual health. Lifestyle, including: Alcohol, nicotine or tobacco, and drug use. Access to firearms. Diet, exercise, and sleep habits. Sunscreen use. Motor vehicle safety. Physical exam Your health care provider may check your: Height and weight. These may be used to calculate your BMI (body mass index). BMI is a measurement that tells if you are at a healthy weight. Waist circumference. This measures the distance around your waistline. This measurement also tells if you are at a healthy weight and may help predict your risk of certain diseases, such as type 2 diabetes and high blood pressure. Heart rate and blood pressure. Body temperature. Skin for abnormal spots. Breasts. What immunizations do I need?  Vaccines are usually given at various ages, according to a schedule. Your health care provider will recommend vaccines for you based on your age, medical history, and lifestyle or other factors, such as travel or where you work. What tests do I need? Screening Your health care provider may recommend screening tests for certain conditions. This may include: Vision and hearing tests. Lipid and cholesterol levels. Pelvic exam and Pap test. Hepatitis B  test. Hepatitis C test. HIV (human immunodeficiency virus) test. STI (sexually transmitted infection) testing, if you are at risk. Tuberculosis skin test if you have symptoms. BRCA-related cancer screening. This may be done if you have a family history of breast, ovarian, tubal, or peritoneal cancers. Talk with your health care provider about your test results, treatment options, and if necessary, the need for more tests. Follow these instructions at home: Eating and drinking  Eat a healthy diet that includes fresh fruits and vegetables, whole grains, lean protein, and low-fat dairy products. Drink enough fluid to keep your urine pale yellow. Do not drink alcohol if: Your health care provider tells you not to drink. You are pregnant, may be pregnant, or are planning to become pregnant. You are under the legal drinking age. In the [place] the legal drinking age is 67. If you drink alcohol: Limit how much you have to 0-1 drink a day. Know how much alcohol is in your drink. In the [place] one drink equals one 12 oz bottle of beer (355 mL), one 5 oz glass of wine (148 mL), or one 1 oz glass of hard liquor (44 mL). Lifestyle Brush your teeth every morning and night with fluoride toothpaste. Floss one time each day. Exercise for at least 30 minutes 5 or more days of the week. Do not use any products that contain nicotine or tobacco. These products include cigarettes, chewing tobacco, and vaping devices, such as e-cigarettes. If you need help quitting, ask your health care provider. Do not use drugs. If you are sexually active, practice safe sex. Use a condom or other form of protection to prevent STIs. If you do not wish to become pregnant, use a form of birth control. If you plan to become pregnant,  see your health care provider for a prepregnancy visit. Find healthy ways to manage stress, such as: Meditation, yoga, or listening to music. Journaling. Talking to a trusted person. Spending time  with friends and family. Safety Always wear your seat belt while driving or riding in a vehicle. Do not drive: If you have been drinking alcohol. Do not ride with someone who has been drinking. When you are tired or distracted. While texting. If you have been using any mind-altering substances or drugs. Wear a helmet and other protective equipment during sports activities. If you have firearms in your house, make sure you follow all gun safety procedures. Seek help if you have been bullied, physically abused, or sexually abused. Use the internet responsibly to avoid dangers, such as online bullying and online sex predators. What's next? Go to your health care provider once a year for an annual wellness visit. Ask your health care provider how often you should have your eyes and teeth checked. Stay up to date on all vaccines. This information is not intended to replace advice given to you by your health care provider. Make sure you discuss any questions you have with your health care provider. Document Revised: 11/20/2020 Document Reviewed: 11/20/2020 Elsevier Patient Education  2023 Elsevier Inc.  

## 2021-12-25 ENCOUNTER — Encounter: Payer: Self-pay | Admitting: Physician Assistant

## 2021-12-25 ENCOUNTER — Ambulatory Visit (INDEPENDENT_AMBULATORY_CARE_PROVIDER_SITE_OTHER): Payer: Medicaid Other | Admitting: Physician Assistant

## 2021-12-25 VITALS — BP 104/72 | HR 130 | Temp 97.7°F | Ht 70.0 in | Wt 96.0 lb

## 2021-12-25 DIAGNOSIS — R000 Tachycardia, unspecified: Secondary | ICD-10-CM | POA: Diagnosis not present

## 2021-12-25 DIAGNOSIS — F419 Anxiety disorder, unspecified: Secondary | ICD-10-CM

## 2021-12-25 DIAGNOSIS — R636 Underweight: Secondary | ICD-10-CM | POA: Diagnosis not present

## 2021-12-25 DIAGNOSIS — Z0000 Encounter for general adult medical examination without abnormal findings: Secondary | ICD-10-CM

## 2021-12-25 MED ORDER — PAROXETINE HCL 10 MG PO TABS: 10.0000 mg | ORAL_TABLET | Freq: Every day | ORAL | 1 refills | Status: DC

## 2021-12-25 NOTE — Progress Notes (Signed)
Complete physical exam   Patient: Melissa [name]   DOB: 2001/06/04   20 y.o. Female  MRN: [id number] Visit Date: 12/25/2021   Chief Complaint  Patient presents with   Annual Exam   Subjective    Melissa [name] is a 21 y.o. female who presents today for a complete physical exam.  She reports consuming a  1200 calorie  diet. The patient does not participate in regular exercise at present. She generally feels fairly well. She does have additional problems to discuss today. Reports feeling more anxious about her health and wants to start a daily medication to help.    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   Past Surgical History:  Procedure Laterality Date   ALVEOLOPLASTY  02/15/2017   x 4   MULTIPLE TOOTH EXTRACTIONS  02/15/2017   #1 through #32 - also palatal tori removal   TYMPANOPLASTY     unilateral - unknown side   TYMPANOPLASTY Left 04/09/2017   Procedure: TYMPANOPLASTY;  Surgeon: Rozetta Nunnery, MD;  Location: [facility];  Service: ENT;  Laterality: Left;   TYMPANOSTOMY TUBE PLACEMENT Bilateral    Social History   Socioeconomic History   Marital status: Significant Other    Spouse name: Not on file   Number of children: Not on file   Years of education: Not on file   Highest education level: Not on file  Occupational History   Not on file  Tobacco Use   Smoking status: Some Days    Years: 2.00    Types: Cigarettes   Smokeless tobacco: Never   Tobacco comments:    somes a few cigarettes a month  Vaping Use   Vaping Use: Every day  Substance and Sexual Activity   Alcohol use: No    Comment: rarely   Drug use: Yes    Types: Marijuana   Sexual activity: Not Currently  Other Topics Concern   Not on file  Social History Narrative    Maternal grandparents are legal guardians; pt. lives with them.  To bring documentation of guardianship DOS.   Social Determinants of Health   Financial Resource Strain: Not on file  Food Insecurity: Not on file  Transportation Needs: Not on file  Physical Activity: Not on file  Stress: Not on file  Social Connections: Not on file  Intimate Partner Violence: Not on file     Medications: Outpatient Medications Prior to Visit  Medication Sig   hydrOXYzine (ATARAX) 25 MG tablet Take 1 tablet (25 mg total) by mouth every 8 (eight) hours as needed for anxiety.   No facility-administered medications prior to visit.    Review of Systems Review of Systems:  A fourteen system review of systems was performed and found to be positive as per HPI.  Last CBC Lab Results  Component Value Date   WBC 9.3 11/14/2021   HGB 14.5 11/14/2021   HCT 43.4 11/14/2021   MCV 95 11/14/2021   MCH 31.7 11/14/2021   RDW 12.8 11/14/2021   PLT 330 35/32/9924   Last metabolic panel Lab Results  Component Value Date   GLUCOSE 90 03/31/2019   NA 141 03/31/2019   K 3.1 (L) 03/31/2019   CL 106 03/31/2019  CO2 23 03/31/2019   BUN 8 03/31/2019   CREATININE 0.69 03/31/2019   GFRNONAA >60 03/31/2019   CALCIUM 9.7 03/31/2019   PHOS 3.2 07/03/2018   PROT 7.0 03/31/2019   ALBUMIN 4.5 03/31/2019   BILITOT 0.2 (L) 03/31/2019   ALKPHOS 56 03/31/2019   AST 17 03/31/2019   ALT 17 03/31/2019   ANIONGAP 12 03/31/2019   Last lipids Lab Results  Component Value Date   CHOL 191 (H) 07/07/2018   HDL 47 07/07/2018   LDLCALC 121 (H) 07/07/2018   TRIG 116 07/07/2018   CHOLHDL 4.1 07/07/2018   Last hemoglobin A1c Lab Results  Component Value Date   HGBA1C 5.1 07/07/2018   Last thyroid functions Lab Results  Component Value Date   TSH 4.422 09/24/2018   Last vitamin D No results found for: "25OHVITD2", "25OHVITD3", "VD25OH"  Objective     BP 104/72   Pulse (!) 130   Temp 97.7 F (36.5 C)    Ht 5' 10"  (1.778 m)   Wt 96 lb (43.5 kg)   LMP  (LMP Unknown)   SpO2 98%   BMI 13.77 kg/m  BP Readings from Last 3 Encounters:  12/25/21 104/72  11/06/21 107/76  09/29/21 108/74   Wt Readings from Last 3 Encounters:  12/25/21 96 lb (43.5 kg)  11/06/21 96 lb (43.5 kg)  09/29/21 97 lb 8 oz (44.2 kg)    Physical Exam   General Appearance:    Thin female. Alert, cooperative, in no acute distress, appears stated age   Head:    Normocephalic, without obvious abnormality, atraumatic  Eyes:    PERRL, conjunctiva/corneas clear, EOM's intact, fundi    benign, both eyes  Ears:    Normal TM's and external ear canals, both ears  Nose:   Nares normal, septum midline, mucosa normal, no drainage    or sinus tenderness  Throat:   Lips, mucosa, and tongue normal; teeth and gums normal  Neck:   Supple, symmetrical, trachea midline, no adenopathy;    thyroid:  no enlargement/tenderness/nodules; no JVD  Back:     Symmetric, no curvature, ROM normal, no CVA tenderness  Lungs:     Clear to auscultation bilaterally, respirations unlabored  Chest Wall:    No tenderness   Heart:    Tachycardic. Normal rhythm. No murmurs, rubs, or gallops.   Breast Exam:    deferred  Abdomen:     Soft, non-tender, bowel sounds active all four quadrants,    no masses, no organomegaly  Pelvic:    deferred  Extremities:   All extremities are intact. No cyanosis or edema  Pulses:   2+ and symmetric all extremities  Skin:   Skin color, turgor normal, lanugo noted, no rashes or lesions  Lymph nodes:   Cervical (submandibular gland) mildly enlarged,  supraclavicular nodes normal  Neurologic:   CNII-XII grossly intact.     Last depression screening scores    12/25/2021    2:36 PM 09/29/2021    4:03 PM 10/15/2020   10:50 AM  PHQ 2/9 Scores  PHQ - 2 Score 0 0 1  PHQ- 9 Score 2 0 2   Last fall risk screening    12/25/2021    2:36 PM  [place] in the past year? 0  Number falls in past yr: 0  Injury with  Fall? 0  Risk for fall due to : No Fall Risks  Follow up Falls evaluation completed     No results  found for any visits on 12/25/21.  Assessment & Plan    Routine Health Maintenance and Physical Exam  Exercise Activities and Dietary recommendations -Discussed diet with good protein intake.    Immunization History  Administered Date(s) Administered   DTP 06/22/2000, 08/17/2000, 10/12/2000, 09/09/2001, 11/09/2005   H1N1 04/03/2008, 05/17/2008   HPV 9-valent 03/12/2017   HPV Quadrivalent 01/27/2017   Hepatitis A, Ped/Adol-2 Dose 01/27/2017   Hepatitis B [birth year]1999, 05/20/2000, 10/12/2000   HiB (PRP-OMP) 06/22/2000, 08/17/2000, 10/12/2000, 05/03/2001   Influenza Split 03/20/2011, 04/11/2012   Influenza Whole 03/22/2007, 04/22/2007, 04/03/2008, 03/15/2009, 03/13/2010   Influenza,inj,Quad PF,6+ Mos 03/06/2013, 02/23/2014, 03/01/2015, 03/12/2017, 03/20/2018, 02/23/2019   Influenza-Unspecified 03/27/2021   MMR 05/03/2001, 11/09/2005   Meningococcal B, OMV 01/27/2017, 03/12/2017   OPV 06/22/2000, 08/17/2000, 05/03/2001   Pneumococcal Conjugate-13 06/22/2000, 08/17/2000, 10/12/2000   Tdap 04/11/2012   Varicella 09/09/2001, 11/09/2005    Health Maintenance  Topic Date Due   COVID-19 Vaccine (1) Never done   HPV VACCINES (3 - 3-dose series) 07/30/2017   PAP-Cervical Cytology Screening  Never done   PAP SMEAR-Modifier  Never done   INFLUENZA VACCINE  01/06/2022   TETANUS/TDAP  04/11/2022   CHLAMYDIA SCREENING  11/07/2022   Hepatitis C Screening  Completed   HIV Screening  Completed    Discussed health benefits of physical activity, and encouraged her to engage in regular exercise appropriate for her age and condition.  Problem List Items Addressed This Visit   None Visit Diagnoses     Healthcare maintenance    -  Primary   Underweight       Relevant Orders   Amb ref to Medical Nutrition Therapy-MNT   Anxiety       Relevant Medications   PARoxetine (PAXIL) 10 MG tablet    Tachycardia          Patient reports establishing with OB/GYN of her preference, no referral needed. Discussed with patient weight concerns, pt reports has been working on gaining weight. Patient is agreeable to nutritionist referral. Will start Paxil 10 mg to help with anxiety which can also help with weight. Recommend to follow-up in 6-8 weeks to reassess mood, weight and medication therapy.  Elevated heart rate likely secondary to anxiety, no red flag s/sx currently present, will continue to monitor.   Return for Mood- 6-8 weeks.       Lorrene Reid, PA-C  [facility] [phone] (phone) [phone] (fax)  [place]

## 2022-02-05 ENCOUNTER — Other Ambulatory Visit: Payer: Self-pay

## 2022-02-05 ENCOUNTER — Ambulatory Visit: Payer: Self-pay | Admitting: Physician Assistant

## 2022-02-05 DIAGNOSIS — F419 Anxiety disorder, unspecified: Secondary | ICD-10-CM

## 2022-02-05 MED ORDER — PAROXETINE HCL 10 MG PO TABS: 10.0000 mg | ORAL_TABLET | Freq: Every day | ORAL | 0 refills | Status: DC

## 2022-02-05 NOTE — Progress Notes (Deleted)
Established patient visit   Patient: Melissa [name]   DOB: 2000-09-27   21 y.o. Female  MRN: [id number] Visit Date: 02/05/2022  No chief complaint on file.  Subjective    HPI  Patient presents for follow-up on mood. Patient was started on Paxil 10 mg for anxiety. Reports tolerating/not tolerating medication. Anxiety has been     12/25/2021    2:36 PM 09/29/2021    4:03 PM 10/15/2020   10:50 AM 05/01/2019    1:35 PM 07/22/2017    4:16 PM  Depression screen PHQ 2/9  Decreased Interest 0 0 1 0 0  Down, Depressed, Hopeless 0 0 0 0 1  PHQ - 2 Score 0 0 1 0 1  Altered sleeping 1 0 0 0 0  Tired, decreased energy 1 0 1 0 1  Change in appetite 0 0 0 0 1  Feeling bad or failure about yourself  0 0 0 2 1  Trouble concentrating 0 0 0 0 0  Moving slowly or fidgety/restless 0 0 0 0 0  Suicidal thoughts 0 0 0 0 0  PHQ-9 Score 2 0 2 2 4   Difficult doing work/chores Not difficult at all Not difficult at all  Not difficult at all Not difficult at all      12/25/2021    2:37 PM  GAD 7 : Generalized Anxiety Score  Nervous, Anxious, on Edge 1  Control/stop worrying 1  Worry too much - different things 1  Trouble relaxing 0  Restless 0  Easily annoyed or irritable 0  Afraid - awful might happen 1  Total GAD 7 Score 4  Anxiety Difficulty Not difficult at all        Medications: Outpatient Medications Prior to Visit  Medication Sig   hydrOXYzine (ATARAX) 25 MG tablet Take 1 tablet (25 mg total) by mouth every 8 (eight) hours as needed for anxiety.   PARoxetine (PAXIL) 10 MG tablet Take 1 tablet (10 mg total) by mouth daily.   No facility-administered medications prior to visit.    Review of Systems Review of Systems:  A fourteen system review of systems was performed and found to be positive as per HPI.  Last CBC Lab Results  Component Value Date   WBC 9.3 11/14/2021   HGB 14.5 11/14/2021   HCT 43.4 11/14/2021   MCV 95 11/14/2021   MCH 31.7 11/14/2021   RDW 12.8  11/14/2021   PLT 330 11/14/2021   Last metabolic panel Lab Results  Component Value Date   GLUCOSE 90 03/31/2019   NA 141 03/31/2019   K 3.1 (L) 03/31/2019   CL 106 03/31/2019   CO2 23 03/31/2019   BUN 8 03/31/2019   CREATININE 0.69 03/31/2019   GFRNONAA >60 03/31/2019   CALCIUM 9.7 03/31/2019   PHOS 3.2 07/03/2018   PROT 7.0 03/31/2019   ALBUMIN 4.5 03/31/2019   BILITOT 0.2 (L) 03/31/2019   ALKPHOS 56 03/31/2019   AST 17 03/31/2019   ALT 17 03/31/2019   ANIONGAP 12 03/31/2019   Last lipids Lab Results  Component Value Date   CHOL 191 (H) 07/07/2018   HDL 47 07/07/2018   LDLCALC 121 (H) 07/07/2018   TRIG 116 07/07/2018   CHOLHDL 4.1 07/07/2018   Last hemoglobin A1c Lab Results  Component Value Date   HGBA1C 5.1 07/07/2018   Last thyroid functions Lab Results  Component Value Date   TSH 4.422 09/24/2018   Last vitamin D No results found for: "25OHVITD2", "25OHVITD3", "VD25OH"  Objective    There were no vitals taken for this visit. BP Readings from Last 3 Encounters:  12/25/21 104/72  11/06/21 107/76  09/29/21 108/74   Wt Readings from Last 3 Encounters:  12/25/21 96 lb (43.5 kg)  11/06/21 96 lb (43.5 kg)  09/29/21 97 lb 8 oz (44.2 kg)    Physical Exam  General:  Cooperative, in no acute distress, appropriate for stated age.  Neuro:  Alert and oriented,  extra-ocular muscles intact  HEENT:  Normocephalic, atraumatic, neck supple  Skin:  no gross rash, warm, pink. Cardiac:  RRR, S1 S2 Respiratory: CTA B/L  Vascular:  Ext warm, no cyanosis apprec.; cap RF less 2 sec. Psych:  No HI/SI, judgement and insight good, Euthymic mood. Full Affect.   No results found for any visits on 02/05/22.  Assessment & Plan     *** Problem List Items Addressed This Visit   None   No follow-ups on file.        Mayer Masker, PA-C  [facility] at [facility] [phone] (phone) [phone] (fax)  [facility]

## 2022-02-11 ENCOUNTER — Encounter: Payer: Self-pay | Admitting: Physician Assistant

## 2022-02-13 ENCOUNTER — Encounter: Payer: Self-pay | Admitting: Physician Assistant

## 2022-03-13 ENCOUNTER — Encounter: Payer: Self-pay | Admitting: Physician Assistant

## 2022-04-09 ENCOUNTER — Other Ambulatory Visit: Payer: Self-pay | Admitting: Physician Assistant

## 2022-04-09 DIAGNOSIS — F419 Anxiety disorder, unspecified: Secondary | ICD-10-CM

## 2022-04-09 MED ORDER — PAROXETINE HCL 10 MG PO TABS: 10.0000 mg | ORAL_TABLET | Freq: Every day | ORAL | 0 refills | Status: DC

## 2022-05-25 NOTE — Progress Notes (Deleted)
Established patient visit   Patient: Melissa [name]   DOB: 07/02/2000   21 y.o. Female  MRN: [id number] Visit Date: 05/26/2022   No chief complaint on file.  Subjective    HPI  Follow up -mood --currently on paroxetine 10 mg to help manage anxiety  -underweight --currently working to gain weight.  --decreasing anxiety may improve appetite and weight.  ---most recent weight 12/25/2021 - 96 pounds  ---?nutrition referral.    Medications: Outpatient Medications Prior to Visit  Medication Sig   hydrOXYzine (ATARAX) 25 MG tablet Take 1 tablet (25 mg total) by mouth every 8 (eight) hours as needed for anxiety.   PARoxetine (PAXIL) 10 MG tablet Take 1 tablet (10 mg total) by mouth daily.   No facility-administered medications prior to visit.    Review of Systems  {Labs (Optional):23779}   Objective    There were no vitals filed for this visit. There is no height or weight on file to calculate BMI.  BP Readings from Last 3 Encounters:  12/25/21 104/72  11/06/21 107/76  09/29/21 108/74    Wt Readings from Last 3 Encounters:  12/25/21 96 lb (43.5 kg)  11/06/21 96 lb (43.5 kg)  09/29/21 97 lb 8 oz (44.2 kg)    Physical Exam  ***  No results found for any visits on 05/26/22.  Assessment & Plan     Problem List Items Addressed This Visit   None    No follow-ups on file.         Carlean Jews, NP  [facility] at [facility] [phone] (phone) [phone] (fax)  [facility]

## 2022-05-26 ENCOUNTER — Ambulatory Visit: Payer: Medicaid Other | Admitting: Nurse Practitioner

## 2022-06-15 ENCOUNTER — Other Ambulatory Visit: Payer: Self-pay | Admitting: Nurse Practitioner

## 2022-06-15 DIAGNOSIS — F419 Anxiety disorder, unspecified: Secondary | ICD-10-CM

## 2022-06-23 ENCOUNTER — Ambulatory Visit: Payer: Medicaid Other | Admitting: Nurse Practitioner

## 2022-08-27 ENCOUNTER — Ambulatory Visit: Payer: Medicaid Other | Admitting: Family Medicine

## 2022-08-27 ENCOUNTER — Encounter: Payer: Self-pay | Admitting: Family Medicine

## 2022-08-27 VITALS — BP 113/81 | HR 106 | Temp 97.9°F | Ht 70.0 in | Wt 109.0 lb

## 2022-08-27 DIAGNOSIS — N898 Other specified noninflammatory disorders of vagina: Secondary | ICD-10-CM | POA: Insufficient documentation

## 2022-08-27 DIAGNOSIS — Z3200 Encounter for pregnancy test, result unknown: Secondary | ICD-10-CM | POA: Diagnosis not present

## 2022-08-27 LAB — POCT URINE PREGNANCY: Preg Test, Ur: NEGATIVE

## 2022-08-27 NOTE — Assessment & Plan Note (Signed)
Patient noted increase in vaginal discharge.  No vaginal itching or burning. - Self swab for gonorrhea chlamydia - HIV blood test

## 2022-08-27 NOTE — Progress Notes (Signed)
   Acute Office Visit  Subjective:     Patient ID: Melissa [name], female    DOB: [birth year], 22 y.o.   MRN: [id number]  Chief Complaint  Patient presents with   Confirm Pregnancy    HPI Patient presents today for confirmation of multiple positive pregnancy tests at home.  Patient had sexual intercourse with her boyfriend on Valentine's Day.  On February 23 she had a positive pregnancy test at home.  Most recent "positive" test was a few days ago.  Last time she had a definitive episode of menstruation was February 6.  Notes that she had some "spotting" on March 3.  Patient not on any hormonal birth control.  They use condoms when she had intercourse.  Some increase in vaginal discharge, no itching/burning.  No abdominal pain.    ROS      Objective:    BP 113/81   Pulse (!) 106   Temp 97.9 F (36.6 C) (Oral)   Ht 5\' 10"  (1.778 m)   Wt 109 lb (49.4 kg)   LMP 08/09/2022 (Exact Date) Comment: pt states that this period was very lite which is not normal for her, Feb period started on 6th  SpO2 100%   BMI 15.64 kg/m    Physical Exam  Results for orders placed or performed in visit on 08/27/22  POCT urine pregnancy  Result Value Ref Range   Preg Test, Ur Negative Negative   General: Alert, oriented. CV: Regular rate and rhythm Pulmonary: No respiratory distress     Assessment & Plan:   Problem List Items Addressed This Visit       Other   Possible pregnancy - Primary    Negative rapid test in the office. - Will confirm with quantitative hCG.  If this is is positive will need to refer patient to OB/GYN for ultrasound - Advised patient if she developed any significant bleeding or significant abdominal pain she can go to the women's center at [facility] for evaluation.      Relevant Orders   POCT urine pregnancy (Completed)   HIV antibody (with reflex)   Beta hCG quant (ref lab)   Beta hCG quant (ref lab)   Vaginal discharge    Patient noted increase in  vaginal discharge.  No vaginal itching or burning. - Self swab for gonorrhea chlamydia - HIV blood test      Relevant Orders   GC/Chlamydia Probe Amp   HIV antibody (with reflex)    No orders of the defined types were placed in this encounter.   Return if symptoms worsen or fail to improve.  Benay Pike, MD

## 2022-08-27 NOTE — Patient Instructions (Signed)
It was nice to meet you today,   We will get some tests to confirm your negative test in the office.   I will let you know the results when I get them.    Depending on the results of the test we may refer you to an OBGYN specialist.    Have a great day,   Dr. Jeannine Kitten

## 2022-08-27 NOTE — Assessment & Plan Note (Signed)
Negative rapid test in the office. - Will confirm with quantitative hCG.  If this is is positive will need to refer patient to OB/GYN for ultrasound - Advised patient if she developed any significant bleeding or significant abdominal pain she can go to the women's center at [facility] for evaluation.

## 2022-08-28 ENCOUNTER — Encounter: Payer: Self-pay | Admitting: Family Medicine

## 2022-08-28 LAB — BETA HCG QUANT (REF LAB)
hCG Quant: <1 m[IU]/mL
hCG Quant: <1 m[IU]/mL

## 2022-08-28 LAB — HIV ANTIBODY (ROUTINE TESTING W REFLEX): HIV Screen 4th Generation wRfx: NONREACTIVE

## 2022-08-30 LAB — GC/CHLAMYDIA PROBE AMP
Chlamydia trachomatis, NAA: NEGATIVE
Neisseria Gonorrhoeae by PCR: NEGATIVE

## 2022-09-21 ENCOUNTER — Encounter: Payer: Self-pay | Admitting: Family Medicine

## 2022-09-22 ENCOUNTER — Encounter: Payer: Self-pay | Admitting: Family Medicine

## 2022-09-22 ENCOUNTER — Ambulatory Visit: Payer: Medicaid Other | Admitting: Family Medicine

## 2022-09-22 VITALS — BP 110/66 | HR 89 | Ht 70.0 in | Wt 109.0 lb

## 2022-09-22 DIAGNOSIS — F419 Anxiety disorder, unspecified: Secondary | ICD-10-CM | POA: Diagnosis not present

## 2022-09-22 DIAGNOSIS — F339 Major depressive disorder, recurrent, unspecified: Secondary | ICD-10-CM | POA: Diagnosis not present

## 2022-09-22 MED ORDER — PAROXETINE HCL 10 MG PO TABS: 10.0000 mg | ORAL_TABLET | Freq: Every day | ORAL | 1 refills | Status: AC

## 2022-09-22 NOTE — Progress Notes (Unsigned)
   Established Patient Office Visit  Subjective   Patient ID: Melissa [name], female    DOB: December 30, 2000  Age: 22 y.o. MRN: [id number]  Chief Complaint  Patient presents with   Depression    Pt has been taking paroxetine for the past 6 months.  She is very satisified with the results.  Does not miss doses.  Has no questions regarding the medication.  Pt is going to [place] later this week on vacation.          {History (Optional):23778}  ROS    Objective:     BP 110/66   Pulse 89   Ht  (1.778 m)   Wt 109 lb (49.4 kg)   LMP 09/06/2022 (Exact Date) Comment: pt states that this period was very lite which is not normal for her, Feb period started on 6th  SpO2 98% Comment: on RA  BMI 15.64 kg/m  {Vitals History (Optional):23777}  Physical Exam Constitutional:      Comments: Thin appearing  HENT:     Nose: Nose normal.  Pulmonary:     Effort: No respiratory distress.  Neurological:     General: No focal deficit present.     Mental Status: She is alert.  Psychiatric:        Mood and Affect: Mood normal.        Behavior: Behavior normal.      No results found for any visits on 09/22/22.  {Labs (Optional):23779}  The ASCVD Risk score (Arnett DK, et al., 2019) failed to calculate for the following reasons:   The 2019 ASCVD risk score is only valid for ages 63 to 5    Assessment & Plan:   Problem List Items Addressed This Visit       Other   Depression, recurrent - Primary    Continue paroxetine.  Has been on it 6 months.      09/22/2022    3:05 PM 08/27/2022    3:22 PM 12/25/2021    2:36 PM 09/29/2021    4:03 PM 10/15/2020   10:50 AM  Depression screen PHQ 2/9  Decreased Interest 0 0 0 0 1  Down, Depressed, Hopeless 0 0 0 0 0  PHQ - 2 Score 0 0 0 0 1  Altered sleeping 0  1 0 0  Tired, decreased energy 1  1 0 1  Change in appetite 0  0 0 0  Feeling bad or failure about yourself  0  0 0 0  Trouble concentrating 0  0 0 0  Moving slowly or  fidgety/restless 0  0 0 0  Suicidal thoughts 0  0 0 0  PHQ-9 Score 1  2 0 2  Difficult doing work/chores Not difficult at all  Not difficult at all Not difficult at all   F/u 6 months      Relevant Medications   PARoxetine (PAXIL) 10 MG tablet   Other Visit Diagnoses     Anxiety       Relevant Medications   PARoxetine (PAXIL) 10 MG tablet       Return in about 6 months (around 03/24/2023) for mood.    Sandre Kitty, MD

## 2022-09-22 NOTE — Assessment & Plan Note (Signed)
Continue paroxetine.  Has been on it 6 months.      09/22/2022    3:05 PM 08/27/2022    3:22 PM 12/25/2021    2:36 PM 09/29/2021    4:03 PM 10/15/2020   10:50 AM  Depression screen PHQ 2/9  Decreased Interest 0 0 0 0 1  Down, Depressed, Hopeless 0 0 0 0 0  PHQ - 2 Score 0 0 0 0 1  Altered sleeping 0  1 0 0  Tired, decreased energy 1  1 0 1  Change in appetite 0  0 0 0  Feeling bad or failure about yourself  0  0 0 0  Trouble concentrating 0  0 0 0  Moving slowly or fidgety/restless 0  0 0 0  Suicidal thoughts 0  0 0 0  PHQ-9 Score 1  2 0 2  Difficult doing work/chores Not difficult at all  Not difficult at all Not difficult at all    F/u 6 months

## 2023-03-30 ENCOUNTER — Ambulatory Visit: Payer: Medicaid Other | Admitting: Family Medicine

## 2024-07-03 ENCOUNTER — Encounter: Payer: Self-pay | Admitting: Family Medicine
